# Patient Record
Sex: Male | Born: 1950
Health system: Southern US, Community
[De-identification: ages and names within clinical notes are randomized; demographics above are authoritative.]

## PROBLEM LIST (undated history)

## (undated) DIAGNOSIS — N189 Chronic kidney disease, unspecified: Secondary | ICD-10-CM

## (undated) DIAGNOSIS — E7800 Pure hypercholesterolemia, unspecified: Secondary | ICD-10-CM

## (undated) DIAGNOSIS — R339 Retention of urine, unspecified: Secondary | ICD-10-CM

## (undated) DIAGNOSIS — M109 Gout, unspecified: Secondary | ICD-10-CM

## (undated) DIAGNOSIS — I10 Essential (primary) hypertension: Secondary | ICD-10-CM

## (undated) HISTORY — DX: Chronic kidney disease, unspecified: N18.9

## (undated) HISTORY — PX: NO PAST SURGERIES: SHX2092

---

## 2001-11-29 ENCOUNTER — Encounter (HOSPITAL_COMMUNITY): Admission: RE | Admit: 2001-11-29 | Discharge: 2001-12-29 | Payer: Self-pay | Admitting: Internal Medicine

## 2002-09-14 ENCOUNTER — Ambulatory Visit (HOSPITAL_COMMUNITY): Admission: RE | Admit: 2002-09-14 | Discharge: 2002-09-14 | Payer: Self-pay | Admitting: Internal Medicine

## 2002-09-14 ENCOUNTER — Encounter: Payer: Self-pay | Admitting: Internal Medicine

## 2006-02-08 ENCOUNTER — Ambulatory Visit: Payer: Self-pay | Admitting: Gastroenterology

## 2006-02-08 ENCOUNTER — Ambulatory Visit (HOSPITAL_COMMUNITY): Admission: RE | Admit: 2006-02-08 | Discharge: 2006-02-08 | Payer: Self-pay | Admitting: Gastroenterology

## 2013-04-05 ENCOUNTER — Inpatient Hospital Stay (HOSPITAL_COMMUNITY): Payer: BC Managed Care – PPO

## 2013-04-05 ENCOUNTER — Encounter (HOSPITAL_COMMUNITY): Payer: Self-pay

## 2013-04-05 ENCOUNTER — Inpatient Hospital Stay (HOSPITAL_COMMUNITY)
Admission: EM | Admit: 2013-04-05 | Discharge: 2013-04-08 | DRG: 316 | Disposition: A | Discharge status: Home / Self Care | Payer: BC Managed Care – PPO | Attending: Internal Medicine | Admitting: Internal Medicine

## 2013-04-05 DIAGNOSIS — E875 Hyperkalemia: Secondary | ICD-10-CM | POA: Diagnosis present

## 2013-04-05 DIAGNOSIS — Z833 Family history of diabetes mellitus: Secondary | ICD-10-CM

## 2013-04-05 DIAGNOSIS — N179 Acute kidney failure, unspecified: Principal | ICD-10-CM | POA: Diagnosis present

## 2013-04-05 DIAGNOSIS — D649 Anemia, unspecified: Secondary | ICD-10-CM | POA: Diagnosis present

## 2013-04-05 DIAGNOSIS — Z8249 Family history of ischemic heart disease and other diseases of the circulatory system: Secondary | ICD-10-CM

## 2013-04-05 DIAGNOSIS — E7800 Pure hypercholesterolemia, unspecified: Secondary | ICD-10-CM | POA: Diagnosis present

## 2013-04-05 DIAGNOSIS — N401 Enlarged prostate with lower urinary tract symptoms: Secondary | ICD-10-CM | POA: Diagnosis present

## 2013-04-05 DIAGNOSIS — N138 Other obstructive and reflux uropathy: Secondary | ICD-10-CM | POA: Diagnosis present

## 2013-04-05 DIAGNOSIS — N1330 Unspecified hydronephrosis: Secondary | ICD-10-CM | POA: Diagnosis present

## 2013-04-05 DIAGNOSIS — D638 Anemia in other chronic diseases classified elsewhere: Secondary | ICD-10-CM | POA: Diagnosis present

## 2013-04-05 DIAGNOSIS — Z23 Encounter for immunization: Secondary | ICD-10-CM

## 2013-04-05 DIAGNOSIS — N400 Enlarged prostate without lower urinary tract symptoms: Secondary | ICD-10-CM | POA: Diagnosis present

## 2013-04-05 DIAGNOSIS — M109 Gout, unspecified: Secondary | ICD-10-CM | POA: Diagnosis present

## 2013-04-05 HISTORY — DX: Pure hypercholesterolemia, unspecified: E78.00

## 2013-04-05 HISTORY — DX: Gout, unspecified: M10.9

## 2013-04-05 LAB — CBC WITH DIFFERENTIAL/PLATELET
Basophils Absolute: 0 10*3/uL (ref 0.0–0.1)
Basophils Relative: 0 % (ref 0–1)
Eosinophils Absolute: 0.1 10*3/uL (ref 0.0–0.7)
Eosinophils Relative: 1 % (ref 0–5)
HCT: 30.9 % — ABNORMAL LOW (ref 39.0–52.0)
Hemoglobin: 10.2 g/dL — ABNORMAL LOW (ref 13.0–17.0)
Lymphocytes Relative: 23 % (ref 12–46)
Lymphs Abs: 1.6 10*3/uL (ref 0.7–4.0)
MCHC: 33 g/dL (ref 30.0–36.0)
MCV: 88 fL (ref 78.0–100.0)
Monocytes Absolute: 0.4 10*3/uL (ref 0.1–1.0)
Monocytes Relative: 6 % (ref 3–12)
Neutro Abs: 4.7 10*3/uL (ref 1.7–7.7)
Neutrophils Relative %: 70 % (ref 43–77)
RBC: 3.51 MIL/uL — ABNORMAL LOW (ref 4.22–5.81)
RDW: 13.2 % (ref 11.5–15.5)
WBC: 6.7 10*3/uL (ref 4.0–10.5)

## 2013-04-05 LAB — URINALYSIS, ROUTINE W REFLEX MICROSCOPIC
Bilirubin Urine: NEGATIVE
Glucose, UA: NEGATIVE mg/dL
Ketones, ur: NEGATIVE mg/dL
Leukocytes, UA: NEGATIVE
Nitrite: NEGATIVE
Protein, ur: NEGATIVE mg/dL
Specific Gravity, Urine: <1.005 — ABNORMAL LOW (ref 1.005–1.030)
pH: 6 (ref 5.0–8.0)

## 2013-04-05 LAB — RETICULOCYTES
RBC.: 3.45 MIL/uL — ABNORMAL LOW (ref 4.22–5.81)
Retic Count, Absolute: 20.7 10*3/uL (ref 19.0–186.0)

## 2013-04-05 LAB — BASIC METABOLIC PANEL
BUN: 72 mg/dL — ABNORMAL HIGH (ref 6–23)
CO2: 21 mEq/L (ref 19–32)
Calcium: 9.8 mg/dL (ref 8.4–10.5)
Chloride: 107 mEq/L (ref 96–112)
Creatinine, Ser: 5.16 mg/dL — ABNORMAL HIGH (ref 0.50–1.35)
GFR calc Af Amer: 13 mL/min — ABNORMAL LOW (ref >90)
GFR calc non Af Amer: 11 mL/min — ABNORMAL LOW (ref >90)
Glucose, Bld: 117 mg/dL — ABNORMAL HIGH (ref 70–99)
Potassium: 5.3 mEq/L — ABNORMAL HIGH (ref 3.5–5.1)

## 2013-04-05 LAB — HEPATIC FUNCTION PANEL
AST: 17 U/L (ref 0–37)
Albumin: 4.5 g/dL (ref 3.5–5.2)
Bilirubin, Direct: 0.1 mg/dL (ref 0.0–0.3)
Total Bilirubin: 0.5 mg/dL (ref 0.3–1.2)

## 2013-04-05 LAB — LIPID PANEL
Cholesterol: 135 mg/dL (ref 0–200)
HDL: 39 mg/dL — ABNORMAL LOW (ref >39)
Triglycerides: 104 mg/dL (ref <150)

## 2013-04-05 MED ORDER — HEPARIN SODIUM (PORCINE) 5000 UNIT/ML IJ SOLN
5000.0000 [IU] | Freq: Three times a day (TID) | INTRAMUSCULAR | Status: DC
  Administered 2013-04-05 – 2013-04-08 (×9): 5000 [IU] via SUBCUTANEOUS
  Filled 2013-04-05 (×10): qty 1

## 2013-04-05 MED ORDER — SODIUM CHLORIDE 0.9 % IJ SOLN
3.0000 mL | Freq: Two times a day (BID) | INTRAMUSCULAR | Status: DC
  Administered 2013-04-06: 3 mL via INTRAVENOUS

## 2013-04-05 MED ORDER — ACETAMINOPHEN 650 MG RE SUPP: 650.0000 mg | Freq: Four times a day (QID) | RECTAL | Status: DC | PRN

## 2013-04-05 MED ORDER — TRAZODONE HCL 50 MG PO TABS: 25.0000 mg | ORAL_TABLET | Freq: Every evening | ORAL | Status: DC | PRN

## 2013-04-05 MED ORDER — ACETAMINOPHEN 325 MG PO TABS: 650.0000 mg | ORAL_TABLET | Freq: Four times a day (QID) | ORAL | Status: DC | PRN

## 2013-04-05 MED ORDER — SENNOSIDES-DOCUSATE SODIUM 8.6-50 MG PO TABS: 1.0000 | ORAL_TABLET | Freq: Every evening | ORAL | Status: DC | PRN

## 2013-04-05 MED ORDER — SODIUM CHLORIDE 0.9 % IV SOLN
INTRAVENOUS | Status: DC
  Administered 2013-04-05 – 2013-04-08 (×9): via INTRAVENOUS

## 2013-04-05 MED ORDER — SODIUM POLYSTYRENE SULFONATE 15 GM/60ML PO SUSP
15.0000 g | Freq: Once | ORAL | Status: AC
  Administered 2013-04-05: 15 g via ORAL
  Filled 2013-04-05: qty 60

## 2013-04-05 MED ORDER — ONDANSETRON HCL 4 MG PO TABS: 4.0000 mg | ORAL_TABLET | Freq: Four times a day (QID) | ORAL | Status: DC | PRN

## 2013-04-05 MED ORDER — ONDANSETRON HCL 4 MG/2ML IJ SOLN: 4.0000 mg | Freq: Four times a day (QID) | INTRAMUSCULAR | Status: DC | PRN

## 2013-04-05 MED ORDER — ALUM & MAG HYDROXIDE-SIMETH 200-200-20 MG/5ML PO SUSP
30.0000 mL | Freq: Four times a day (QID) | ORAL | Status: DC | PRN
Start: 2013-04-05 — End: 2013-04-08

## 2013-04-05 MED ORDER — HYDROCODONE-ACETAMINOPHEN 5-325 MG PO TABS
1.0000 | ORAL_TABLET | ORAL | Status: DC | PRN
  Administered 2013-04-06 (×2): 1 via ORAL
  Filled 2013-04-05 (×2): qty 1

## 2013-04-05 NOTE — H&P (Signed)
[facility] History and Physical  Noah Cook WUJ:[id number] DOB: 11/21/1950 DOA: 04/05/2013  Referring physician:  PCP: Colette Ribas, MD  Specialists:   Chief Complaint: Abnormal labs  HPI: Noah Cook is a very pleasant 62 y.o. male with a past medical history that includes gout, hypercholesterolemia, BPH who presents to the emergency department today with the chief complaint of abnormal labs. Information is obtained from the patient and the wife who is at the bedside. He indicates that he was put on allopurinol for a year ago and ever since then he has routine lab work usually yearly. He states that he had blood work done 2 days ago and that his primary care provider called him to tell him that "his kidneys were not functioning properly. He was advised to come to the emergency department. He denies any recent dysuria hematuria urgency or flank pain. He does report that he urinates frequently and has some hesitancy with initiating flow that he attributes to an enlarged prostate. He also indicates that when he was put on allopurinol his primary care provider told him to "drink plenty of water". He states that since that time he has made a conscious effort to drink water. He denies any fever chills nausea vomiting or sick contacts. He does indicate that he had a headache for several days last week but he took Tylenol and resolved. He denies any chest pain palpitations shortness of breath or cough. He indicates that he has not had a gout attack since she's been put on the allopurinol and prior to that time he would have severe gout attacks in multiple joints every 2 months. Initial evaluation in the emergency department yields a hemoglobin of 10.2 potassium of 5.3 creatinine of 5.16 and a BUN of 72. EKG yields normal sinus rhythm. Symptoms came on suddenly characterized as moderate. [facility] are asked to admit.   Review of Systems: 10 point review of systems completed all  systems are negative except as indicated in the history of present illness.  Past Medical History  Diagnosis Date  . Gout   . Hypercholesteremia    History reviewed. No pertinent past surgical history. Social History:  reports that he has never smoked. He does not have any smokeless tobacco history on file. He reports that he does not drink alcohol or use illicit drugs. Patient is married and lives at home with his wife. He is a retired Investment banker, corporate. He plays golf frequently and is independent with ADLs.  No Known Allergies  No family history on file. mother is alive 60 years old she has diabetes and a history of CAD. Father deceased at 75 years old from an MI. He has 2 siblings whose collective medical history is positive for diabetes and ovarian cancer.  Prior to Admission medications   Medication Sig Start Date End Date Taking? Authorizing Provider  allopurinol (ZYLOPRIM) 300 MG tablet Take 300 mg by mouth daily.   Yes Historical Provider, MD  simvastatin (ZOCOR) 20 MG tablet Take 20 mg by mouth daily.   Yes Historical Provider, MD   Physical Exam: Filed Vitals:   04/05/13 0909  BP: 161/70  Pulse: 65  Temp: 97.7 F (36.5 C)  Resp: 20     General:  Well-nourished looking a bit older than his stated age no acute distress  Eyes: PE RRL, EOMI, no scleral icterus  ENT: Ears clear nose without drainage oropharynx without erythema or exudate. Mucous membranes of his mouth are pink slightly  dry  Neck: Supple no JVD full range of motion no lymphadenopathy  Cardiovascular: Regular rate and rhythm. No murmur no gallop no rub. Trace lower extremity edema nonpitting  Respiratory: Normal effort breath sounds clear bilaterally to auscultation. No wheeze no rhonchi no crackles  Abdomen: Round soft positive bowel sounds nondistended. Nontender to palpation no mass organomegaly noted no costovertebral angle tenderness  Skin: Warm and dry no rash no lesions  noted  Musculoskeletal: Moves all extremities joints without swelling or erythema. Joints nontender  Psychiatric: Calm cooperative  Neurologic: Cranial nerves II through XII grossly intact speech clear  Labs on Admission:  Basic Metabolic Panel:  Recent Labs Lab 04/05/13 0924  NA 140  K 5.3*  CL 107  CO2 21  GLUCOSE 117*  BUN 72*  CREATININE 5.16*  CALCIUM 9.8   Liver Function Tests: No results found for this basename: AST, ALT, ALKPHOS, BILITOT, PROT, ALBUMIN,  in the last 168 hours No results found for this basename: LIPASE, AMYLASE,  in the last 168 hours No results found for this basename: AMMONIA,  in the last 168 hours CBC:  Recent Labs Lab 04/05/13 0924  WBC 6.7  NEUTROABS 4.7  HGB 10.2*  HCT 30.9*  MCV 88.0  PLT 204   Cardiac Enzymes: No results found for this basename: CKTOTAL, CKMB, CKMBINDEX, TROPONINI,  in the last 168 hours  BNP (last 3 results) No results found for this basename: PROBNP,  in the last 8760 hours CBG: No results found for this basename: GLUCAP,  in the last 168 hours  Radiological Exams on Admission: No results found.  EKG: Independently reviewed normal sinus rhythm  Assessment/Plan Principal Problem:   Acute renal failure: Etiology unclear in patient with pre-renal pattern. Will admit to telemetry. We'll vigorously hydrate with IV fluids. Hold his home medications for now. Will monitor intake and output. If no improvement will consider renal ultrasound.  Active Problems: Hyperkalemia: Likely related to #1. Will provide IV fluids. Will recheck in the a.m. Will monitor on telemetry. EKG with normal sinus rhythm.   Anemia:normocytic. Denies melena or BRBPR. Will check FOBT. Obtain anemia panel. Monitor    Gout: Stable at baseline. Patient indicates he has not had a gout flare since she's been on allopurinol. Is verbalizing some concern regarding the possibility of having to be taken off allopurinol if it is toxic to his  kidneys.    Hypercholesteremia: Will hold his statin for now. Will check liver function function tests and lipid panel.  BPH: Stable at baseline.    Code Status:full  Family Communication: wife at bedside Disposition Plan: home when ready Time spent: 60 minutes  Gwenyth Bender [facility] Pager [phone]  If 7PM-7AM, please contact night-coverage [email or web address] Password [facility] 04/05/2013, 11:51 AM

## 2013-04-05 NOTE — ED Provider Notes (Signed)
CSN: [id number]     Arrival date & time 04/05/13  2956 History  This chart was scribed for Candyce Churn, MD by Quintella Reichert, ED scribe.  This patient was seen in room APA02/APA02 and the patient's care was started at 9:57 AM.   Chief Complaint  Patient presents with  . Abnormal Lab    The history is provided by the patient. No language interpreter was used.    HPI Comments: Noah Cook is a 62 y.o. male with h/o gout and hypercholesteremia who presents to the Emergency Department complaining of abnormal labs.  Pt had bloodwork taken by his PCP 2 days ago which revealed creatinine of 4.75 and potassium of 5.4 and today he was advised to come to the ED.  He has no complaints presently and denies pain to any area, fever, CP, SOB, emesis, diarrhea, or any other associated symptoms.  He denies prior h/o kidney problems to his knowledge.  He states he urinates frequently but this is chronic for him.  Pt has been taking allopurinol for over a year.   Past Medical History  Diagnosis Date  . Gout   . Hypercholesteremia    History reviewed. No pertinent past surgical history.  No family history on file.  History  Substance Use Topics  . Smoking status: Never Smoker   . Smokeless tobacco: Not on file  . Alcohol Use: No     Review of Systems  Constitutional: Negative for fever.  Respiratory: Negative for shortness of breath.   Cardiovascular: Negative for chest pain.  Gastrointestinal: Negative for vomiting and diarrhea.  Genitourinary: Positive for frequency (chronic, at baseline).  All other systems reviewed and are negative.     Allergies  Review of patient's allergies indicates no known allergies.  Home Medications   Current Outpatient Rx  Name  Route  Sig  Dispense  Refill  . allopurinol (ZYLOPRIM) 300 MG tablet   Oral   Take 300 mg by mouth daily.         . simvastatin (ZOCOR) 20 MG tablet   Oral   Take 20 mg by mouth daily.          BP 161/70   Pulse 65  Temp(Src) 97.7 F (36.5 C) (Oral)  Resp 20  Ht 5\' 6"  (1.676 m)  Wt 218 lb (98.884 kg)  BMI 35.2 kg/m2  SpO2 99%  Physical Exam  Nursing note and vitals reviewed. Constitutional: He is oriented to person, place, and time. He appears well-developed and well-nourished. No distress.  HENT:  Head: Normocephalic and atraumatic.  Mouth/Throat: Oropharynx is clear and moist.  Eyes: Conjunctivae are normal. Pupils are equal, round, and reactive to light. No scleral icterus.  Neck: Neck supple.  Cardiovascular: Normal rate, regular rhythm, normal heart sounds and intact distal pulses.   No murmur heard. Pulmonary/Chest: Effort normal and breath sounds normal. No stridor. No respiratory distress. He has no wheezes. He has no rales.  Abdominal: Soft. He exhibits no distension. There is no tenderness.  Musculoskeletal: Normal range of motion. He exhibits no edema.  Neurological: He is alert and oriented to person, place, and time.  Skin: Skin is warm and dry. No rash noted.  Psychiatric: He has a normal mood and affect. His behavior is normal.    ED Course  Procedures (including critical care time)  DIAGNOSTIC STUDIES: Oxygen Saturation is 99% on room air, normal by my interpretation.    COORDINATION OF CARE: 10:00 AM: Discussed treatment plan which includes  labs and likely admission.  Pt expressed understanding and agreed to plan.   Labs Review Labs Reviewed  CBC WITH DIFFERENTIAL - Abnormal; Notable for the following:    RBC 3.51 (*)    Hemoglobin 10.2 (*)    HCT 30.9 (*)    All other components within normal limits  BASIC METABOLIC PANEL - Abnormal; Notable for the following:    Potassium 5.3 (*)    Glucose, Bld 117 (*)    BUN 72 (*)    Creatinine, Ser 5.16 (*)    GFR calc non Af Amer 11 (*)    GFR calc Af Amer 13 (*)    All other components within normal limits  URINALYSIS, ROUTINE W REFLEX MICROSCOPIC - Abnormal; Notable for the following:    Specific Gravity,  Urine <1.005 (*)    Hgb urine dipstick SMALL (*)    All other components within normal limits  URINE MICROSCOPIC-ADD ON - Abnormal; Notable for the following:    Bacteria, UA FEW (*)    All other components within normal limits   EKG - NSR, rate 60, normal axis, normal intervals, no ST/T changes  Imaging Review No results found.  MDM   1. Acute renal failure   2. Gout   3. Hypercholesteremia    62 year old male with acute undifferentiated renal failure. Potassium mildly elevated. New EKG changes. Admitted to internal medicine.    I personally performed the services described in this documentation, which was scribed in my presence. The recorded information has been reviewed and is accurate.    Candyce Churn, MD 04/05/13 [id number]

## 2013-04-05 NOTE — H&P (Signed)
Patient seen, independently examined and chart reviewed. I agree with exam, assessment and plan discussed with Toya Smothers, NP.  Very pleasant 62 year old man with a history of gout, well controlled with allopurinol who was seen recently by his primary care physician for routine followup and refill on allopurinol. Routine blood work was checked which revealed acute renal failure. Hydration was encouraged and repeat lab work was checked which showed no substantial improvement. Laboratory workup in the emergency department confirmed acute renal failure.  The patient feels well and has no recent complaints. He has not had an attack of gout in 2 or more years. He voids well and makes adequate urine. He has been told that he has a large prostate but does not feel that this significantly interferes with emptying. Besides allopurinol he takes cholesterol medication. He takes no NSAIDs or diuretics.  He is afebrile with stable vital signs. He appears well and exam was unremarkable. Laboratory workup was notable for mild hyperkalemia, BUN 72, creatinine 5.16. EKG showed normal sinus rhythm with no acute changes.  Etiology of his acute renal failure is unclear. While both allopurinol and statin therapy could cause renal failure this is not favored. Pattern does suggest a prerenal etiology and he does have a history suggestive of BPH.  Will start with hydration, renal ultrasound, urine studies and repeat basic metabolic panel in the morning. Will consult nephrology in the morning. Minimal hyperkalemia currently asymptomatic, treat with Kayexalate and IV fluids.  Discussed above with sister, wife and son at bedside. All questions answered.  Brendia Sacks, MD [facility] [phone]

## 2013-04-05 NOTE — Progress Notes (Signed)
Pt came up to floor from ED in NAD. Will continue to monitor.  

## 2013-04-05 NOTE — ED Notes (Signed)
Pt was sent by pmd, told that lab work from Monday was ab. Normal. His "potassium was high and kidney function was off", pt denies any complaints

## 2013-04-06 DIAGNOSIS — N1330 Unspecified hydronephrosis: Secondary | ICD-10-CM

## 2013-04-06 DIAGNOSIS — N400 Enlarged prostate without lower urinary tract symptoms: Secondary | ICD-10-CM

## 2013-04-06 LAB — BASIC METABOLIC PANEL
BUN: 62 mg/dL — ABNORMAL HIGH (ref 6–23)
CO2: 21 mEq/L (ref 19–32)
Calcium: 9.3 mg/dL (ref 8.4–10.5)
Chloride: 108 mEq/L (ref 96–112)
Creatinine, Ser: 5.01 mg/dL — ABNORMAL HIGH (ref 0.50–1.35)
Glucose, Bld: 121 mg/dL — ABNORMAL HIGH (ref 70–99)

## 2013-04-06 LAB — CBC
HCT: 27.9 % — ABNORMAL LOW (ref 39.0–52.0)
MCH: 29.6 pg (ref 26.0–34.0)
MCHC: 33.3 g/dL (ref 30.0–36.0)
MCV: 88.9 fL (ref 78.0–100.0)
Platelets: 187 10*3/uL (ref 150–400)
RDW: 13.4 % (ref 11.5–15.5)

## 2013-04-06 LAB — VITAMIN B12: Vitamin B-12: 516 pg/mL (ref 211–911)

## 2013-04-06 LAB — IRON AND TIBC
Iron: 119 ug/dL (ref 42–135)
Saturation Ratios: 56 % — ABNORMAL HIGH (ref 20–55)
UIBC: 95 ug/dL — ABNORMAL LOW (ref 125–400)

## 2013-04-06 LAB — FOLATE: Folate: >20 ng/mL

## 2013-04-06 MED ORDER — TAMSULOSIN HCL 0.4 MG PO CAPS
0.4000 mg | ORAL_CAPSULE | Freq: Every day | ORAL | Status: DC
  Administered 2013-04-06 – 2013-04-08 (×3): 0.4 mg via ORAL
  Filled 2013-04-06 (×3): qty 1

## 2013-04-06 NOTE — Progress Notes (Signed)
[facility] PROGRESS NOTE  Noah Cook ZOX:[id number] DOB: Jan 19, 1951 DOA: 04/05/2013 PCP: Colette Ribas, MD  Assessment/Plan: Acute renal failure: likely related to obstructive uropathy secondary to BPH. Renal US yields moderate bilateral hydronephrosis and substantial post void residual. Will provide foley. Continue to  hydrate with IV fluids. Will monitor intake and output. Creatinine trending downward slightly. Continue to monitor.   Active Problems:   Hyperkalemia: Likely related to #1. Resolved s/p kayexalate.   Anemia:normocytic. Denies melena or BRBPR. Will check FOBT. Anemia panel yields TIBC 214, ferritin 399, RBC 3.45.   Gout: Stable at baseline. Patient indicates he has not had a gout flare since she's been on allopurinol. Continue to hold allopurinol until renal function improves.   Hypercholesteremia: Will hold his statin for now. Lipid panel unremarkable. LFT's unremarkable   BPH: Will need OP urology work up. Pt wishes to see Dr Annabell Howells. Will arrange  Code Status: full Family Communication: wife at bedside Disposition Plan: hopefully home tomorro   Consultants:  nephrology  Procedures:  none  Antibiotics:  none  HPI/Subjective: Awake alert NAD. Denies pain/discomfort  Objective: Filed Vitals:   04/06/13 0646  BP: 133/77  Pulse: 62  Temp: 97.8 F (36.6 C)  Resp: 20    Intake/Output Summary (Last 24 hours) at 04/06/13 0954 Last data filed at 04/06/13 0649  Gross per 24 hour  Intake    240 ml  Output   2400 ml  Net  -2160 ml   Filed Weights   04/05/13 0909 04/05/13 1209  Weight: 98.884 kg (218 lb) 98.884 kg (218 lb)    Exam:   General:  Obese NAD  Cardiovascular: RRR No MGR No LE edema  Respiratory: normal effort BS clear to auscultation bilaterally. No wheeze no rhonchi  Abdomen: obese soft +BS non-tender to palpation Foley draining clear yellow uring  Musculoskeletal: No clubbing no cyanosis. Joints without  swelling/erythema   Data Reviewed: Basic Metabolic Panel:  Recent Labs Lab 04/05/13 0924 04/06/13 0546  NA 140 140  K 5.3* 4.6  CL 107 108  CO2 21 21  GLUCOSE 117* 121*  BUN 72* 62*  CREATININE 5.16* 5.01*  CALCIUM 9.8 9.3   Liver Function Tests:  Recent Labs Lab 04/05/13 1100  AST 17  ALT 10  ALKPHOS 49  BILITOT 0.5  PROT 7.3  ALBUMIN 4.5   No results found for this basename: LIPASE, AMYLASE,  in the last 168 hours No results found for this basename: AMMONIA,  in the last 168 hours CBC:  Recent Labs Lab 04/05/13 0924 04/06/13 0546  WBC 6.7 6.6  NEUTROABS 4.7  --   HGB 10.2* 9.3*  HCT 30.9* 27.9*  MCV 88.0 88.9  PLT 204 187   Cardiac Enzymes: No results found for this basename: CKTOTAL, CKMB, CKMBINDEX, TROPONINI,  in the last 168 hours BNP (last 3 results) No results found for this basename: PROBNP,  in the last 8760 hours CBG: No results found for this basename: GLUCAP,  in the last 168 hours  No results found for this or any previous visit (from the past 240 hour(s)).   Studies: US Renal  04/05/2013   *RADIOLOGY REPORT*  Clinical Data: Acute renal failure  RENAL / URINARY TRACT ULTRASOUND  Technique:  Complete ultrasound exam of the kidneys and urinary bladder was performed.  Comparison: No comparison studies available.  Findings:  The right kidney measures 13.6 cm in long axis.  The left kidney measures 13.1 cm.  Bilateral moderate hydronephrosis is  evident.  The bladder is markedly distended.  Prevoid volume is calculated at 1250 ml.  Postvoid residual is calculated at 1009 ml.  Impression:  Moderate bilateral hydronephrosis with substantial postvoid residual in the urinary bladder.   Original Report Authenticated By: Kennith Center, M.D.    Scheduled Meds: . heparin  5,000 Units Subcutaneous Q8H  . sodium chloride  3 mL Intravenous Q12H   Continuous Infusions: . sodium chloride 150 mL/hr at 04/06/13 0409    Principal Problem:   Acute renal  failure Active Problems:   Gout   Hypercholesteremia   BPH (benign prostatic hyperplasia)   Anemia   Hyperkalemia   Hydronephrosis    Time spent: 30 minutes    [facility] M  [facility] Pager [phone]. If 7PM-7AM, please contact night-coverage at [email or web address], password [facility] 04/06/2013, 9:54 AM  LOS: 1 day

## 2013-04-06 NOTE — Progress Notes (Signed)
1210 - Patient c/o of pain and burning with foley catheter.  Urine now pink/red in tubing and bag.  Dr. Irene Limbo notified.  No new orders.  Continue to monitor.

## 2013-04-06 NOTE — Consult Note (Signed)
Reason for Consult: Acute kidney injury Referring Physician: Dr. Julieanne Cotton is an 62 y.o. male.  HPI: He patient was history of gout, hypercholesterolemia presently seen by his primary care physician and blood work was done and patient was found to have elevated BUN and creatinine hence sent to the emergency room for further evaluation. Patient denies any previous history of kidney stone or chronic renal failure. Patient states that he had history of recurrent gout because of such was put on allopurinol and has been doing very well for the last couple of years. Recently since he doesn't have any reactivation of his gout patient is not taking any nonsteroidal. The only complaint he has is some urgency and difficulty in initiation of his urine. According to the patient he was told that he has an enlarged prostate during his a normal physical exam. Presently he denies any nausea or vomiting and no other complaints.  Past Medical History  Diagnosis Date  . Gout   . Hypercholesteremia     History reviewed. No pertinent past surgical history.  No family history on file.  Social History:  reports that he has never smoked. He does not have any smokeless tobacco history on file. He reports that he does not drink alcohol or use illicit drugs.  Allergies: No Known Allergies  Medications: I have reviewed the patient's current medications.  Results for orders placed during the hospital encounter of 04/05/13 (from the past 48 hour(s))  CBC WITH DIFFERENTIAL     Status: Abnormal   Collection Time    04/05/13  9:24 AM      Result Value Range   WBC 6.7  4.0 - 10.5 K/uL   RBC 3.51 (*) 4.22 - 5.81 MIL/uL   Hemoglobin 10.2 (*) 13.0 - 17.0 g/dL   HCT 14.7 (*) 82.9 - 56.2 %   MCV 88.0  78.0 - 100.0 fL   MCH 29.1  26.0 - 34.0 pg   MCHC 33.0  30.0 - 36.0 g/dL   RDW 13.0  86.5 - 78.4 %   Platelets 204  150 - 400 K/uL   Neutrophils Relative % 70  43 - 77 %   Neutro Abs 4.7  1.7 - 7.7 K/uL    Lymphocytes Relative 23  12 - 46 %   Lymphs Abs 1.6  0.7 - 4.0 K/uL   Monocytes Relative 6  3 - 12 %   Monocytes Absolute 0.4  0.1 - 1.0 K/uL   Eosinophils Relative 1  0 - 5 %   Eosinophils Absolute 0.1  0.0 - 0.7 K/uL   Basophils Relative 0  0 - 1 %   Basophils Absolute 0.0  0.0 - 0.1 K/uL  BASIC METABOLIC PANEL     Status: Abnormal   Collection Time    04/05/13  9:24 AM      Result Value Range   Sodium 140  135 - 145 mEq/L   Potassium 5.3 (*) 3.5 - 5.1 mEq/L   Chloride 107  96 - 112 mEq/L   CO2 21  19 - 32 mEq/L   Glucose, Bld 117 (*) 70 - 99 mg/dL   BUN 72 (*) 6 - 23 mg/dL   Creatinine, Ser 6.96 (*) 0.50 - 1.35 mg/dL   Calcium 9.8  8.4 - 29.5 mg/dL   GFR calc non Af Amer 11 (*) >90 mL/min   GFR calc Af Amer 13 (*) >90 mL/min   Comment: (NOTE)     The eGFR has  been calculated using the CKD EPI equation.     This calculation has not been validated in all clinical situations.     eGFR's persistently <90 mL/min signify possible Chronic Kidney     Disease.  URINALYSIS, ROUTINE W REFLEX MICROSCOPIC     Status: Abnormal   Collection Time    04/05/13  9:24 AM      Result Value Range   Color, Urine YELLOW  YELLOW   APPearance CLEAR  CLEAR   Specific Gravity, Urine <1.005 (*) 1.005 - 1.030   pH 6.0  5.0 - 8.0   Glucose, UA NEGATIVE  NEGATIVE mg/dL   Hgb urine dipstick SMALL (*) NEGATIVE   Bilirubin Urine NEGATIVE  NEGATIVE   Ketones, ur NEGATIVE  NEGATIVE mg/dL   Protein, ur NEGATIVE  NEGATIVE mg/dL   Urobilinogen, UA 0.2  0.0 - 1.0 mg/dL   Nitrite NEGATIVE  NEGATIVE   Leukocytes, UA NEGATIVE  NEGATIVE  URINE MICROSCOPIC-ADD ON     Status: Abnormal   Collection Time    04/05/13  9:24 AM      Result Value Range   RBC / HPF 7-10  <3 RBC/hpf   Bacteria, UA FEW (*) RARE  HEPATIC FUNCTION PANEL     Status: None   Collection Time    04/05/13 11:00 AM      Result Value Range   Total Protein 7.3  6.0 - 8.3 g/dL   Albumin 4.5  3.5 - 5.2 g/dL   AST 17  0 - 37 U/L   ALT 10  0  - 53 U/L   Alkaline Phosphatase 49  39 - 117 U/L   Total Bilirubin 0.5  0.3 - 1.2 mg/dL   Bilirubin, Direct 0.1  0.0 - 0.3 mg/dL   Indirect Bilirubin 0.4  0.3 - 0.9 mg/dL  VITAMIN E45     Status: None   Collection Time    04/05/13 11:00 AM      Result Value Range   Vitamin B-12 516  211 - 911 pg/mL   Comment: Performed at [facility]  FOLATE     Status: None   Collection Time    04/05/13 11:00 AM      Result Value Range   Folate >20.0     Comment: (NOTE)     Reference Ranges            Deficient:       0.4 - 3.3 ng/mL            Indeterminate:   3.4 - 5.4 ng/mL            Normal:              > 5.4 ng/mL     Performed at [facility]  IRON AND TIBC     Status: Abnormal   Collection Time    04/05/13 11:00 AM      Result Value Range   Iron 119  42 - 135 ug/dL   TIBC 409 (*) 811 - 914 ug/dL   Saturation Ratios 56 (*) 20 - 55 %   UIBC 95 (*) 125 - 400 ug/dL   Comment: Performed at [facility]  FERRITIN     Status: Abnormal   Collection Time    04/05/13 11:00 AM      Result Value Range   Ferritin 399 (*) 22 - 322 ng/mL   Comment: Performed at [facility]  RETICULOCYTES     Status:  Abnormal   Collection Time    04/05/13 11:00 AM      Result Value Range   Retic Ct Pct 0.6  0.4 - 3.1 %   RBC. 3.45 (*) 4.22 - 5.81 MIL/uL   Retic Count, Manual 20.7  19.0 - 186.0 K/uL  LIPID PANEL     Status: Abnormal   Collection Time    04/05/13 11:00 AM      Result Value Range   Cholesterol 135  0 - 200 mg/dL   Triglycerides 161  <096 mg/dL   HDL 39 (*) >04 mg/dL   Total CHOL/HDL Ratio 3.5     VLDL 21  0 - 40 mg/dL   LDL Cholesterol 75  0 - 99 mg/dL   Comment:            Total Cholesterol/HDL:CHD Risk     Coronary Heart Disease Risk Table                         Men   Women      1/2 Average Risk   3.4   3.3      Average Risk       5.0   4.4      2 X Average Risk   9.6   7.1      3 X Average Risk  23.4   11.0                Use the calculated  Patient Ratio     above and the CHD Risk Table     to determine the patient's CHD Risk.                ATP III CLASSIFICATION (LDL):      <100     mg/dL   Optimal      540-981  mg/dL   Near or Above                        Optimal      130-159  mg/dL   Borderline      191-478  mg/dL   High      >295     mg/dL   Very High  SODIUM, URINE, RANDOM     Status: None   Collection Time    04/05/13  7:15 PM      Result Value Range   Sodium, Ur 55    CREATININE, URINE, RANDOM     Status: None   Collection Time    04/05/13  7:15 PM      Result Value Range   Creatinine, Urine 44.76    BASIC METABOLIC PANEL     Status: Abnormal   Collection Time    04/06/13  5:46 AM      Result Value Range   Sodium 140  135 - 145 mEq/L   Potassium 4.6  3.5 - 5.1 mEq/L   Chloride 108  96 - 112 mEq/L   CO2 21  19 - 32 mEq/L   Glucose, Bld 121 (*) 70 - 99 mg/dL   BUN 62 (*) 6 - 23 mg/dL   Creatinine, Ser 6.21 (*) 0.50 - 1.35 mg/dL   Calcium 9.3  8.4 - 30.8 mg/dL   GFR calc non Af Amer 11 (*) >90 mL/min   GFR calc Af Amer 13 (*) >90 mL/min   Comment: (NOTE)     The eGFR has been calculated using the  CKD EPI equation.     This calculation has not been validated in all clinical situations.     eGFR's persistently <90 mL/min signify possible Chronic Kidney     Disease.  CBC     Status: Abnormal   Collection Time    04/06/13  5:46 AM      Result Value Range   WBC 6.6  4.0 - 10.5 K/uL   RBC 3.14 (*) 4.22 - 5.81 MIL/uL   Hemoglobin 9.3 (*) 13.0 - 17.0 g/dL   HCT 16.1 (*) 09.6 - 04.5 %   MCV 88.9  78.0 - 100.0 fL   MCH 29.6  26.0 - 34.0 pg   MCHC 33.3  30.0 - 36.0 g/dL   RDW 40.9  81.1 - 91.4 %   Platelets 187  150 - 400 K/uL    US Renal  04/05/2013   *RADIOLOGY REPORT*  Clinical Data: Acute renal failure  RENAL / URINARY TRACT ULTRASOUND  Technique:  Complete ultrasound exam of the kidneys and urinary bladder was performed.  Comparison: No comparison studies available.  Findings:  The right kidney  measures 13.6 cm in long axis.  The left kidney measures 13.1 cm.  Bilateral moderate hydronephrosis is evident.  The bladder is markedly distended.  Prevoid volume is calculated at 1250 ml.  Postvoid residual is calculated at 1009 ml.  Impression:  Moderate bilateral hydronephrosis with substantial postvoid residual in the urinary bladder.   Original Report Authenticated By: Kennith Center, M.D.    Review of Systems  Constitutional: Negative for fever and chills.  Respiratory: Negative for shortness of breath.   Cardiovascular: Negative for orthopnea and leg swelling.  Gastrointestinal: Negative for nausea, vomiting and diarrhea.  Neurological: Negative for weakness.   Blood pressure 133/77, pulse 62, temperature 97.8 F (36.6 C), temperature source Oral, resp. rate 20, height 5\' 6"  (1.676 m), weight 98.884 kg (218 lb), SpO2 97.00%. Physical Exam  Constitutional: No distress.  Neck: No JVD present.  Cardiovascular: Normal rate and regular rhythm.   No murmur heard. Respiratory: No respiratory distress. He has no wheezes.  GI: There is no tenderness. There is no rebound.  Musculoskeletal: He exhibits no edema.    Assessment/Plan: Problem #1 acute kidney injury. At this moment with significant amount of urine in the bladder and also bilateral moderate hydronephrosis the etiology for his acute kidney injury seems to be secondary to obstructive uropathy. Presently other etiologies cannot ruled out. Problem #2 history of gout. Presently patient is on allopurinol and no recent activation. Problem #3 history of hypercholesterolemia Problem #4 history of BPH Problem #5 anemia most likely iron deficiency. Problem #6 mild hyperkalemia presently corrected. Plan: Agree with hydration Discussed with the patient about putting the catheter and patient has agreed. We'll check his basic metabolic panel in the morning. Patient possibly would benefit from urology evaluation for his BPH. We'll check iron  studies in the morning and also his uric acid level.  Exodus Kutzer S 04/06/2013, 9:13 AM

## 2013-04-06 NOTE — Care Management Note (Unsigned)
    Page 1 of 1   04/06/2013     3:13:06 PM   CARE MANAGEMENT NOTE 04/06/2013  Patient:  Noah Cook, Noah Cook   Account Number:  [id number]  Date Initiated:  04/06/2013  Documentation initiated by:  Rosemary Holms  Subjective/Objective Assessment:   Pt lives at home with spouse. Declined HH/DME. Will follow     Action/Plan:   Anticipated DC Date:     Anticipated DC Plan:        DC Planning Services  CM consult      Choice offered to / List presented to:             Status of service:  In process, will continue to follow Medicare Important Message given?   (If response is "NO", the following Medicare IM given date fields will be blank) Date Medicare IM given:   Date Additional Medicare IM given:    Discharge Disposition:    Per UR Regulation:    If discussed at Long Length of Stay Meetings, dates discussed:    Comments:  04/06/13 Rosemary Holms RN BSN CM

## 2013-04-06 NOTE — Progress Notes (Signed)
UR Chart Review Completed  

## 2013-04-06 NOTE — Progress Notes (Signed)
Patient seen, independently examined and chart reviewed. I agree with exam, assessment and plan discussed with Toya Smothers, NP.  Renal ultrasound demonstrated moderate bilateral hydronephrosis with substantial postvoid residual in the urinary bladder. BUN and creatinine modestly improved today.  He feels fine. He has no complaints. He appears calm and comfortable.  Presumably his acute renal failure secondary to urinary retention from BPH. Foley catheter placed with immediate output of a large amount of urine. Plan repeat basic metabolic panel in the morning and likely discharge home if improved. Outpatient followup will be arranged with urology.  Etiology of his anemia is unclear. He has no active bleeding. Will need outpatient followup, consider outpatient GI referral.  Message left for PCP to update on care.  Brendia Sacks, MD [facility] [phone]

## 2013-04-07 LAB — BASIC METABOLIC PANEL
BUN: 56 mg/dL — ABNORMAL HIGH (ref 6–23)
CO2: 24 mEq/L (ref 19–32)
CO2: 24 mEq/L (ref 19–32)
Calcium: 9.4 mg/dL (ref 8.4–10.5)
Creatinine, Ser: 4.45 mg/dL — ABNORMAL HIGH (ref 0.50–1.35)
GFR calc non Af Amer: 13 mL/min — ABNORMAL LOW (ref >90)
Glucose, Bld: 125 mg/dL — ABNORMAL HIGH (ref 70–99)
Glucose, Bld: 112 mg/dL — ABNORMAL HIGH (ref 70–99)
Potassium: 4.7 mEq/L (ref 3.5–5.1)
Sodium: 141 mEq/L (ref 135–145)
Sodium: 140 mEq/L (ref 135–145)

## 2013-04-07 LAB — IRON AND TIBC: Iron: 120 ug/dL (ref 42–135)

## 2013-04-07 LAB — CBC
MCH: 29.3 pg (ref 26.0–34.0)
MCHC: 33.1 g/dL (ref 30.0–36.0)
MCV: 88.6 fL (ref 78.0–100.0)
Platelets: 191 10*3/uL (ref 150–400)
RBC: 3.51 MIL/uL — ABNORMAL LOW (ref 4.22–5.81)

## 2013-04-07 MED ORDER — HYDROCODONE-ACETAMINOPHEN 5-325 MG PO TABS: 1.0000 | ORAL_TABLET | ORAL | Status: DC | PRN

## 2013-04-07 MED ORDER — TAMSULOSIN HCL 0.4 MG PO CAPS: 0.4000 mg | ORAL_CAPSULE | Freq: Every day | ORAL | Status: DC

## 2013-04-07 MED ORDER — PNEUMOCOCCAL VAC POLYVALENT 25 MCG/0.5ML IJ INJ
0.5000 mL | INJECTION | Freq: Once | INTRAMUSCULAR | Status: AC
  Administered 2013-04-07: 0.5 mL via INTRAMUSCULAR
  Filled 2013-04-07: qty 0.5

## 2013-04-07 NOTE — Discharge Summary (Signed)
Afternoon creatinine still elevated at 4.0 weight with significant BUN. We'll plan to keep patient overnight and recheck creatinine tomorrow. If improved, can likely go home tomorrow. Otherwise, patient seen and discussed and examined with my nurse practitioner. Agree with above assessment and plan for discharge hopefully tomorrow.

## 2013-04-07 NOTE — Progress Notes (Signed)
Subjective: Interval History: has no complaint of nausea or vomiting. Presently he denies any difficulty increasing. Overall patient offers no complaints..  Objective: Vital signs in last 24 hours: Temp:  [97.9 F (36.6 C)-98.2 F (36.8 C)] 98.2 F (36.8 C) (09/19 0522) Pulse Rate:  [63-67] 67 (09/19 0522) Resp:  [18-20] 20 (09/19 0522) BP: (125-147)/(72-78) 147/72 mmHg (09/19 0522) SpO2:  [97 %-99 %] 98 % (09/19 0522) Weight change:   Intake/Output from previous day: 09/18 0701 - 09/19 0700 In: 5369.2 [P.O.:720; I.V.:4649.2] Out: 8000 [Urine:8000] Intake/Output this shift:    General appearance: alert, cooperative and no distress Resp: clear to auscultation bilaterally Cardio: regular rate and rhythm, S1, S2 normal, no murmur, click, rub or gallop GI: soft, non-tender; bowel sounds normal; no masses,  no organomegaly Extremities: extremities normal, atraumatic, no cyanosis or edema  Lab Results:  Recent Labs  04/06/13 0546 04/07/13 0552  WBC 6.6 7.5  HGB 9.3* 10.3*  HCT 27.9* 31.1*  PLT 187 191   BMET:  Recent Labs  04/06/13 0546 04/07/13 0552  NA 140 141  K 4.6 5.0  CL 108 109  CO2 21 24  GLUCOSE 121* 125*  BUN 62* 56*  CREATININE 5.01* 4.45*  CALCIUM 9.3 9.4   No results found for this basename: PTH,  in the last 72 hours Iron Studies:  Recent Labs  04/05/13 1100  IRON 119  TIBC 214*  FERRITIN 399*    Studies/Results: US Renal  04/05/2013   *RADIOLOGY REPORT*  Clinical Data: Acute renal failure  RENAL / URINARY TRACT ULTRASOUND  Technique:  Complete ultrasound exam of the kidneys and urinary bladder was performed.  Comparison: No comparison studies available.  Findings:  The right kidney measures 13.6 cm in long axis.  The left kidney measures 13.1 cm.  Bilateral moderate hydronephrosis is evident.  The bladder is markedly distended.  Prevoid volume is calculated at 1250 ml.  Postvoid residual is calculated at 1009 ml.  Impression:  Moderate  bilateral hydronephrosis with substantial postvoid residual in the urinary bladder.   Original Report Authenticated By: Kennith Center, M.D.    I have reviewed the patient's current medications.  Assessment/Plan: Problem #1 acute kidney injury presently seems to be obstructive. His BUN is 56 and creatinine is 4.45 renal function seems to be improving. Presently S/P a Foley catheter  placementwith good urine output. Problem #2 hyperkalemia potassium 5 has improved Problem #3 anemia seems to be secondary to chronic disease. Problem #4 history of BPH Problem #5 history of hypercholesterolemia  Problem #6 history of gout and no recent activation. Problem #7 metabolic bone disease his calcium and phosphorus was in acceptable range. Plan: Continue his hydration. If patient is going to be discharged possibly needs the  Foley catheter until patient is seen by urology. Check his present metabolic panel in the morning.   LOS: 2 days   Tayten Heber S 04/07/2013,8:24 AM

## 2013-04-07 NOTE — Discharge Summary (Signed)
Physician Discharge Summary  Noah Cook ZOX:[id number] DOB: 1950-10-06 DOA: 04/05/2013  PCP: Colette Ribas, MD  Admit date: 04/05/2013 Discharge date: 04/07/2013  Time spent: 40 minutes  Recommendations for Outpatient Follow-up:  1. Patient has appointment with Dr. Hoyle Sauer with urology 04/12/13. 2. Follow up with nephrology Dr. Kristian Covey in 4 weeks  Discharge Diagnoses:  Principal Problem:   Acute renal failure Active Problems:   Gout   Hypercholesteremia   BPH (benign prostatic hyperplasia)   Anemia   Hyperkalemia   Hydronephrosis   Discharge Condition: stable  Diet recommendation: heart healthy   Filed Weights   04/05/13 0909 04/05/13 1209  Weight: 98.884 kg (218 lb) 98.884 kg (218 lb)    History of present illness:  Noah Cook is a very pleasant 62 y.o. male with a past medical history that includes gout, hypercholesterolemia, BPH who presented to the emergency department on 04/05/13 with the chief complaint of abnormal labs. He indicated that he was put on allopurinol  a year ago and ever since then he has routine lab work usually yearly. He stated that he had blood work done 2 days prior and that his primary care provider called him to tell him that "his kidneys were not functioning properly". He was advised to come to the emergency department. He denied any recent dysuria hematuria urgency or flank pain. He did report that he urinates frequently and has some hesitancy with initiating flow that he attributes to an enlarged prostate. He also indicated that when he was put on allopurinol his primary care provider told him to "drink plenty of water". He stated that since that time he has made a conscious effort to drink water. He denied any fever chills nausea vomiting or sick contacts. He did indicate that he had a headache for several days  but he took Tylenol and resolved. He denied any chest pain palpitations shortness of breath or cough. He indicated that he had  not had a gout attack since she's been put on the allopurinol and prior to that time he would have severe gout attacks in multiple joints every 2 months. Initial evaluation in the emergency department yielded a hemoglobin of 10.2 potassium of 5.3 creatinine of 5.16 and a BUN of 72. EKG yielded normal sinus rhythm.     Hospital Course:  Acute renal failure: likely related to obstructive uropathy secondary to BPH. Renal US yielded moderate bilateral hydronephrosis and substantial post void residual. Foley provided with immediate output of large amounts clear urine. Hydrated with IV fluids. Creatinine trended down slowly. He was evaluated by nephrology who opined obstructive uropathy due to BPH. Will follow up with Dr. Kristian Covey in 4 weeks  Active Problems:  BPH: Will need OP urology work up. Has an appointment with Dr. Hoyle Sauer in [facility] 04/12/13. Will be discharged with foley catheter. Flomax started 04/06/13.   Hyperkalemia: Likely related to #1. Resolved s/p kayexalate.   Anemia:normocytic. Denies melena or BRBPR. FOBT negative. Anemia panel yields TIBC 214, ferritin 399, RBC 3.45. Likely of chronic disease. Recommend OP follow up with PCP  Gout: Stable at baseline. Patient indicates he has not had a gout flare since he's been on allopurinol. Continue to hold allopurinol until renal function improves.   Hypercholesteremia: Resume statin at discharge. Lipid panel unremarkable. LFT's unremarkable     Procedures:  none  Consultations:  Dr. Kristian Covey nephrology  Discharge Exam: Filed Vitals:   04/07/13 0522  BP: 147/72  Pulse: 67  Temp: 98.2 F (36.8 C)  Resp: 20    General: well nourished NAD Cardiovascular: RRR No MGR No LE edema Respiratory: Normal effort BS clear bilaterally no wheeze no rhonchi Abdomen: soft +BS non-tender to palpation. Foley draining slightly pink clear urine.   Discharge Instructions     Medication List    STOP taking these medications        allopurinol 300 MG tablet  Commonly known as:  ZYLOPRIM      TAKE these medications       HYDROcodone-acetaminophen 5-325 MG per tablet  Commonly known as:  NORCO/VICODIN  Take 1-2 tablets by mouth every 4 (four) hours as needed.     simvastatin 20 MG tablet  Commonly known as:  ZOCOR  Take 20 mg by mouth daily.     tamsulosin 0.4 MG Caps capsule  Commonly known as:  FLOMAX  Take 1 capsule (0.4 mg total) by mouth daily.       No Known Allergies     Follow-up Information   Follow up with [facility] S, MD In 4 weeks.   Specialty:  Nephrology   Contact information:   [address] [place] [address] [phone]       Follow up with Antony Haste, MD On 04/12/2013.   Specialty:  Urology   Contact information:   [address] [place] [address] [phone]        The results of significant diagnostics from this hospitalization (including imaging, microbiology, ancillary and laboratory) are listed below for reference.    Significant Diagnostic Studies: US Renal  04/05/2013   *RADIOLOGY REPORT*  Clinical Data: Acute renal failure  RENAL / URINARY TRACT ULTRASOUND  Technique:  Complete ultrasound exam of the kidneys and urinary bladder was performed.  Comparison: No comparison studies available.  Findings:  The right kidney measures 13.6 cm in long axis.  The left kidney measures 13.1 cm.  Bilateral moderate hydronephrosis is evident.  The bladder is markedly distended.  Prevoid volume is calculated at 1250 ml.  Postvoid residual is calculated at 1009 ml.  Impression:  Moderate bilateral hydronephrosis with substantial postvoid residual in the urinary bladder.   Original Report Authenticated By: Kennith Center, M.D.    Microbiology: No results found for this or any previous visit (from the past 240 hour(s)).   Labs: Basic Metabolic Panel:  Recent Labs Lab 04/05/13 0924 04/06/13 0546 04/07/13 0552  NA 140 140 141  K  5.3* 4.6 5.0  CL 107 108 109  CO2 21 21 24   GLUCOSE 117* 121* 125*  BUN 72* 62* 56*  CREATININE 5.16* 5.01* 4.45*  CALCIUM 9.8 9.3 9.4  PHOS  --   --  4.6   Liver Function Tests:  Recent Labs Lab 04/05/13 1100  AST 17  ALT 10  ALKPHOS 49  BILITOT 0.5  PROT 7.3  ALBUMIN 4.5   No results found for this basename: LIPASE, AMYLASE,  in the last 168 hours No results found for this basename: AMMONIA,  in the last 168 hours CBC:  Recent Labs Lab 04/05/13 0924 04/06/13 0546 04/07/13 0552  WBC 6.7 6.6 7.5  NEUTROABS 4.7  --   --   HGB 10.2* 9.3* 10.3*  HCT 30.9* 27.9* 31.1*  MCV 88.0 88.9 88.6  PLT 204 187 191   Cardiac Enzymes:  Recent Labs Lab 04/07/13 0801  CKTOTAL 78   BNP: BNP (last 3 results) No results found for this basename: PROBNP,  in the last 8760 hours CBG: No  results found for this basename: GLUCAP,  in the last 168 hours     Signed:  Gwenyth Bender  [facility] 04/07/2013, 12:13 PM

## 2013-04-08 LAB — BASIC METABOLIC PANEL
BUN: 51 mg/dL — ABNORMAL HIGH (ref 6–23)
BUN: 50 mg/dL — ABNORMAL HIGH (ref 6–23)
CO2: 25 mEq/L (ref 19–32)
Chloride: 108 mEq/L (ref 96–112)
Chloride: 100 mEq/L (ref 96–112)
Creatinine, Ser: 3.48 mg/dL — ABNORMAL HIGH (ref 0.50–1.35)
GFR calc Af Amer: 18 mL/min — ABNORMAL LOW (ref >90)
Glucose, Bld: 119 mg/dL — ABNORMAL HIGH (ref 70–99)
Potassium: 4.6 mEq/L (ref 3.5–5.1)
Potassium: 4.3 mEq/L (ref 3.5–5.1)
Sodium: 141 mEq/L (ref 135–145)

## 2013-04-08 NOTE — Discharge Summary (Signed)
Physician Discharge Summary  Noah Cook:[id number] DOB: 18-Jun-1951 DOA: 04/05/2013  PCP: Colette Ribas, MD  Admit date: 04/05/2013 Discharge date: 04/08/2013  Time spent: 25 minutes  Recommendations for Outpatient Follow-up:  1. Patient has appointment with Dr. Hoyle Sauer with urology 04/12/13.  2. Follow up with nephrology Dr. Kristian Covey in 2 weeks  Discharge Diagnoses:  Principal Problem:  Acute renal failure  Active Problems:  Gout  Hypercholesteremia  BPH (benign prostatic hyperplasia)  Anemia  Hyperkalemia  Hydronephrosis   Discharge Condition: stable  Diet recommendation: heart healthy   Filed Weights   04/05/13 0909 04/05/13 1209  Weight: 98.884 kg (218 lb) 98.884 kg (218 lb)    History of present illness:  Noah Cook is a very pleasant 62 y.o. male with a past medical history that includes gout, hypercholesterolemia, BPH who presented to the emergency department on 04/05/13 with the chief complaint of abnormal labs. He indicated that he was put on allopurinol a year ago and ever since then he has routine lab work usually yearly. He stated that he had blood work done 2 days prior and that his primary care provider called him to tell him that "his kidneys were not functioning properly". He was advised to come to the emergency department. He denied any recent dysuria hematuria urgency or flank pain. He did report that he urinates frequently and has some hesitancy with initiating flow that he attributes to an enlarged prostate. He also indicated that when he was put on allopurinol his primary care provider told him to "drink plenty of water". He stated that since that time he has made a conscious effort to drink water. He denied any fever chills nausea vomiting or sick contacts. Initial evaluation in the emergency department yielded a hemoglobin of 10.2 potassium of 5.3 creatinine of 5.16 and a BUN of 72. EKG yielded normal sinus rhythm.   Hospital Course:  Acute  renal failure: likely related to obstructive uropathy secondary to BPH. Renal US yielded moderate bilateral hydronephrosis and substantial post void residual. Foley provided with immediate output of large amounts clear urine. Hydrated with IV fluids. Creatinine trended down slowly. By day of discharge, his creatinine was at  3.78 in the morning & then down to 3.49 by evening.  Pt at this point would like to very much go home.  Given his compliance, ability to follow directions (advised patient to drink water >1 lit daily), family support, foley placement 7 otherwise healthy kidneys, i am ok with this plan.    He was evaluated by nephrology who opined obstructive uropathy due to BPH. Will follow up with Dr. Kristian Covey in 2 weeks   Active Problems:  BPH: Will need OP urology work up. Has an appointment with Dr. Hoyle Sauer in [facility] 04/12/13. Will be discharged with foley catheter. Flomax started 04/06/13.   Hyperkalemia: Likely related to #1. Resolved s/p kayexalate.   Anemia:normocytic. Denies melena or BRBPR. FOBT negative. Anemia panel yields TIBC 214, ferritin 399, RBC 3.45. Likely of chronic disease. Recommend OP follow up with PCP   Gout: Stable at baseline. Patient indicates he has not had a gout flare since he's been on allopurinol. Continue to hold allopurinol until renal function improves, he has been instructed to restart this medicine only after he has seen nephrology as an outpatient.   Hypercholesteremia: Resume statin at discharge. Lipid panel unremarkable. LFT's unremarkable   Procedures:  None  Consultations:  Dr. Kristian Covey nephrology  Discharge Exam: Filed Vitals:   04/08/13 0535  BP: 129/69  Pulse: 63  Temp: 98.3 F (36.8 C)  Resp: 18    General: Alert and oriented x3, no acute distress Cardiovascular: Regular rate and rhythm, S1-S2 Respiratory: Clear to auscultation bilaterally Abdomen: Soft, nontender, nondistended, positive bowel sounds Extremities: No clubbing or  cyanosis or edema  Discharge Instructions  Discharge Orders   Future Orders Complete By Expires   Diet - low sodium heart healthy  As directed    Discharge instructions  As directed    Comments:     Restart allopurinol after you see kidney doctors   Increase activity slowly  As directed        Medication List    STOP taking these medications       allopurinol 300 MG tablet  Commonly known as:  ZYLOPRIM      TAKE these medications       HYDROcodone-acetaminophen 5-325 MG per tablet  Commonly known as:  NORCO/VICODIN  Take 1-2 tablets by mouth every 4 (four) hours as needed.     simvastatin 20 MG tablet  Commonly known as:  ZOCOR  Take 20 mg by mouth daily.     tamsulosin 0.4 MG Caps capsule  Commonly known as:  FLOMAX  Take 1 capsule (0.4 mg total) by mouth daily.       No Known Allergies     Follow-up Information   Follow up with [facility] S, MD In 2 weeks.   Specialty:  Nephrology   Contact information:   [address] [place] [address] [phone]       Follow up with Antony Haste, MD On 04/12/2013.   Specialty:  Urology   Contact information:   [address] [place] [address] [phone]       Follow up with Colette Ribas, MD In 1 month.   Specialty:  Family Medicine   Contact information:   [address] [place] [address] [phone]        The results of significant diagnostics from this hospitalization (including imaging, microbiology, ancillary and laboratory) are listed below for reference.    Significant Diagnostic Studies: US Renal  04/05/2013   *RADIOLOGY REPORT*  Clinical Data: Acute renal failure  RENAL / URINARY TRACT ULTRASOUND  Technique:  Complete ultrasound exam of the kidneys and urinary bladder was performed.  Comparison: No comparison studies available.  Findings:  The right kidney measures 13.6 cm in long axis.  The left kidney measures  13.1 cm.  Bilateral moderate hydronephrosis is evident.  The bladder is markedly distended.  Prevoid volume is calculated at 1250 ml.  Postvoid residual is calculated at 1009 ml.  Impression:  Moderate bilateral hydronephrosis with substantial postvoid residual in the urinary bladder.   Original Report Authenticated By: Kennith Center, M.D.    Microbiology: No results found for this or any previous visit (from the past 240 hour(s)).   Labs: Basic Metabolic Panel:  Recent Labs Lab 04/05/13 0924 04/06/13 0546 04/07/13 0552 04/07/13 1640 04/08/13 0524  NA 140 140 141 140 141  K 5.3* 4.6 5.0 4.7 4.6  CL 107 108 109 107 108  CO2 21 21 24 24 25   GLUCOSE 117* 121* 125* 112* 119*  BUN 72* 62* 56* 53* 51*  CREATININE 5.16* 5.01* 4.45* 4.08* 3.78*  CALCIUM 9.8 9.3 9.4 9.0 9.2  PHOS  --   --  4.6  --   --    Liver Function Tests:  Recent Labs Lab 04/05/13 1100  AST 17  ALT 10  ALKPHOS 49  BILITOT 0.5  PROT 7.3  ALBUMIN 4.5   CBC:  Recent Labs Lab 04/05/13 0924 04/06/13 0546 04/07/13 0552  WBC 6.7 6.6 7.5  NEUTROABS 4.7  --   --   HGB 10.2* 9.3* 10.3*  HCT 30.9* 27.9* 31.1*  MCV 88.0 88.9 88.6  PLT 204 187 191   Cardiac Enzymes:  Recent Labs Lab 04/07/13 0801  CKTOTAL 78     Signed:  Josefita Weissmann K  [facility] 04/08/2013, 10:06 AM

## 2013-04-08 NOTE — Progress Notes (Signed)
Noah Cook  MRN: [id number]  DOB/AGE: January 05, 1951 62 y.o.  Primary Care Physician:GOLDING, Chancy Hurter, MD  Admit date: 04/05/2013  Chief Complaint:  Chief Complaint  Patient presents with  . Abnormal Lab    S-Pt presented on  04/05/2013 with  Chief Complaint  Patient presents with  . Abnormal Lab  .    Pt today feels better    Pt offers no specific complaints.  Meds . heparin  5,000 Units Subcutaneous Q8H  . sodium chloride  3 mL Intravenous Q12H  . tamsulosin  0.4 mg Oral Daily      Physical Exam: Vital signs in last 24 hours: Temp:  [97.1 F (36.2 C)-98.5 F (36.9 C)] 98.3 F (36.8 C) (09/20 0535) Pulse Rate:  [63-65] 63 (09/20 0535) Resp:  [18-20] 18 (09/20 0535) BP: (129-135)/(69-78) 129/69 mmHg (09/20 0535) SpO2:  [99 %-100 %] 100 % (09/20 0535) Weight change:  Last BM Date: 04/07/13  Intake/Output from previous day: 09/19 0701 - 09/20 0700 In: 3791.7 [P.O.:1020; I.V.:2771.7] Out: 5700 [Urine:5700]     Physical Exam: General- pt is awake,alert, oriented to time place and person Resp- No acute REsp distress, CTA B/L NO Rhonchi CVS- S1S2 regular in rate and rhythm GIT- BS+, soft, NT, ND EXT- NO LE Edema, Cyanosis   Lab Results: CBC  Recent Labs  04/06/13 0546 04/07/13 0552  WBC 6.6 7.5  HGB 9.3* 10.3*  HCT 27.9* 31.1*  PLT 187 191    BMET  Recent Labs  04/07/13 1640 04/08/13 0524  NA 140 141  K 4.7 4.6  CL 107 108  CO2 24 25  GLUCOSE 112* 119*  BUN 53* 51*  CREATININE 4.08* 3.78*  CALCIUM 9.0 9.2   Creat Trend 2014 5.16==>3.78  Potassium 5.3==>4.6  Lab Results  Component Value Date   CALCIUM 9.2 04/08/2013   PHOS 4.6 04/07/2013               Impression: 1)Renal  AKI secondary to Post renal obstruction  Pt with B/L hydro and large post voidal residual. S/p Catheter placement Creat improving    2) CVS- BP stable  3)Anemia HGb at goal (9--11)   4)CKD Mineral-Bone Disorder Phosphorus at  goal. Calcium at goal  5)Urology- BPH Follow up planned as outpt.  6)ELectrolytes  Normokalemic- was hyperkalemia earlier  NOrmonatremic   7)Acid base Co2 at goal     Plan:  Will continue current care. Pt will benefit from nephrology appt as outpt.       BHUTANI,MANPREET S 04/08/2013, 9:04 AM

## 2013-04-08 NOTE — Progress Notes (Signed)
Pt is to be discharged home today. Pt is in NAD, IV is out, all paperwork has been reviewed/discussed with patient, and there are no questions/concerns at this time. Assessment is unchanged from this morning. Pt is to be accompanied downstairs by staff and family via wheelchair.  

## 2013-06-01 ENCOUNTER — Other Ambulatory Visit: Payer: Self-pay | Admitting: Urology

## 2013-06-26 ENCOUNTER — Encounter (HOSPITAL_COMMUNITY): Payer: Self-pay | Admitting: Pharmacy Technician

## 2013-06-27 NOTE — Patient Instructions (Addendum)
20      Your procedure is scheduled on:  Friday 06/30/2013  Report to [facility] at 0830 AM.  Call this number if you have problems the night before or morning of surgery:  [phone]   Remember:             Do not eat food or drink liquids AFTER MIDNIGHT!  Take these medicines the morning of surgery with A SIP OF WATER: simvastatin, flomax    [facility] IS NOT RESPONSIBLE FOR ANY BELONGINGS OR VALUABLES BROUGHT TO HOSPITAL.  Marland Kitchen  Leave suitcase in the car. After surgery it may be brought to your room.  For patients admitted to the hospital, checkout time is 11:00 AM the day of              Discharge.    DO NOT WEAR JEWELRY,MAKE-UP,LOTIONS,POWDERS,PERFUMES,CONTACTS , DENTURES OR BRIDGEWORK ,AND DO NOT WEAR FALSE EYELASHES                                    Patients discharged the day of surgery will not be allowed to drive home.  If going home the same day of surgery, must have someone stay with you  first 24 hrs.at home and arrange for someone to drive you home from the Hospital.                          Special Instructions:              Please read over the following fact sheets that you were given:             1. [facility] [facility] PREPARING FOR SURGERY SHEET                             Randolm Idol     [phone]                FAILURE TO FOLLOW THESE INSTRUCTIONS MAY RESULT IN CANCELLATION OF YOUR SURGERY!               Patient Signature:___________________________

## 2013-06-28 ENCOUNTER — Encounter (HOSPITAL_COMMUNITY)
Admission: RE | Admit: 2013-06-28 | Discharge: 2013-06-28 | Disposition: A | Discharge status: Home / Self Care | Payer: BC Managed Care – PPO | Source: Ambulatory Visit | Attending: Urology | Admitting: Urology

## 2013-06-28 ENCOUNTER — Encounter (HOSPITAL_COMMUNITY): Payer: Self-pay

## 2013-06-28 HISTORY — DX: Retention of urine, unspecified: R33.9

## 2013-06-28 LAB — URINALYSIS, ROUTINE W REFLEX MICROSCOPIC
Bilirubin Urine: NEGATIVE
Glucose, UA: NEGATIVE mg/dL
Ketones, ur: NEGATIVE mg/dL
Nitrite: NEGATIVE
Specific Gravity, Urine: 1.004 — ABNORMAL LOW (ref 1.005–1.030)
pH: 6.5 (ref 5.0–8.0)

## 2013-06-28 LAB — CBC
HCT: 38.2 % — ABNORMAL LOW (ref 39.0–52.0)
MCH: 29.6 pg (ref 26.0–34.0)
MCV: 87.6 fL (ref 78.0–100.0)
Platelets: 207 10*3/uL (ref 150–400)
RBC: 4.36 MIL/uL (ref 4.22–5.81)
WBC: 8.5 10*3/uL (ref 4.0–10.5)

## 2013-06-28 LAB — BASIC METABOLIC PANEL
CO2: 25 mEq/L (ref 19–32)
Calcium: 9.6 mg/dL (ref 8.4–10.5)
Chloride: 101 mEq/L (ref 96–112)
Glucose, Bld: 117 mg/dL — ABNORMAL HIGH (ref 70–99)
Sodium: 136 mEq/L (ref 135–145)

## 2013-06-28 LAB — URINE MICROSCOPIC-ADD ON

## 2013-06-28 NOTE — Progress Notes (Signed)
ekg 04-05-13 epic

## 2013-06-28 NOTE — Progress Notes (Signed)
Micro, ua results faxed to dr [email or web address] by epic

## 2013-06-28 NOTE — Progress Notes (Signed)
bmet results faxed by epic to [email or web address] 

## 2013-06-29 MED ORDER — DEXTROSE 5 % IV SOLN
2.0000 g | Freq: Once | INTRAVENOUS | Status: AC
  Administered 2013-06-30: 2 g via INTRAVENOUS
  Filled 2013-06-29 (×2): qty 2

## 2013-06-29 NOTE — Interval H&P Note (Signed)
History and Physical Interval Note:  06/29/2013 5:05 PM  Urine Cx growing 10K GNR which is a pretty low count giving a chronic foley. Also, his WBC is normal, indicating no infection.    Noah Cook

## 2013-06-29 NOTE — H&P (Signed)
History of Present Illness            F/u BPH, acute urinary retention, bilateral hydro and ARF. Seen by nephrology Dr. Kristian Covey and primary care Dr. Hassan Rowan    1-BPH - Sept 2014 voiding with weak stream, hesitancy and urinary frequency and nocturia x 4. Renal/bladder U/S - 50 grams. Normal DRE, added finasteride to tamsulosin    2-Acute urinary retention - Sept 2014 foley placed - a prevoid volume of 1250 and a postvoid volume of 1000 cc. A Foley catheter was placed with a large amount of urine output.   -November 2014 urodynamics - revealed a decreased capacity and unstable bladder. There was no incontinence. The voiding pattern was obstructed with an elevated postvoid. There was a good bladder contraction. There was no vesicoureteral reflux. Cystogram shows a diverticulum at the dome of the bladder or possibly a colovesical fistula.     3- bilateral hydronephrosis - Sept 2014 renal U/S - A renal ultrasound revealed moderate bilateral hydroureteronephrosis and a prevoid volume of 1250 and a postvoid volume of 1000 cc. A Foley catheter was placed with a large amount of urine output.   -Oct 2014 Renal ultrasound today-I reviewed on the images, comparison Sept 2014 u/s - there is continued right hydronephrosis but it is mild and decreased compared to the ultrasound September 2014. There is a round cyst in the right upper pole 1.7 cm and a round cyst/hypoechoic area in the right lower pole 2.6 cm. Again seen was a 1.35 cm echogenic shadowing focus in the right lower pole. This may be a stone. It raises the question he could have a stone in the ureter. The left kidney showed no hydronephrosis which I improved since the Foley was placed. The bladder was empty with a balloon noted in the bladder.  -Oct 2014 CT A/P - shows a large right lower pole stone which is stable since 2004 as well as what appears to be a right UPJ obstruction. This is also stable going back to 2004. The bladder is thick  walled and the prostate median lobe is enlarged .    4- ARF - Set 2014 BUN 72, Cr 5.16, foley placed, repeat Cr 3.49.   -Sept 2014 bun 58, cr 2.87 with foley      Oct 2014 Interval hx  He returns on combination therapy. He has a Foley catheter. He is admitted for fevers or bladder pain.       Past Medical History Problems  1. History of Arthritis (V13.4) 2. History of Gout (274.9) 3. History of hypercholesterolemia (V12.29) 4. History of Nonfunctioning kidney (593.9)  Surgical History Problems  1. History of No Surgical Problems  Current Meds 1. Allopurinol 300 MG Oral Tablet;  Therapy: (Recorded:24Sep2014) to Recorded 2. Finasteride 5 MG Oral Tablet; Take 1 tablet by mouth every day;  Therapy: 24Sep2014 to (Evaluate:19Sep2015)  Requested for: 24Sep2014; Last  Rx:24Sep2014 Ordered 3. Hydrocodone-Acetaminophen 5-325 MG Oral Tablet;  Therapy: 20Sep2014 to Recorded 4. Simvastatin 20 MG Oral Tablet;  Therapy: (Recorded:24Sep2014) to Recorded 5. Tamsulosin HCl - 0.4 MG Oral Capsule; Take one capsule daily;  Therapy: 21Oct2014 to (Evaluate:16Oct2015)  Requested for: 23Oct2014; Last  Rx:21Oct2014 Ordered 6. Tamsulosin HCl - 0.4 MG Oral Capsule;  Therapy: (Recorded:24Sep2014) to Recorded  Allergies Medication  1. No Known Drug Allergies  Family History Problems  1. Family history of Acute Myocardial Infarction (V17.3) : Father 2. Family history of Colon Cancer (V16.0) 3. Family history of Death In The Family Father 4.  Family history of Diabetes Mellitus (V18.0) 5. Family history of Family Health Status Number Of Children   1 son 1 daughter 14. Family history of Family Health Status Of Mother - Alive 7. Family history of Heart Disease (V17.49)  Social History Problems  1. Denied: History of Alcohol Use 2. Caffeine Use   1 per day 3. Marital History - Currently Married 4. Never A Smoker 5. Retired From Work  Review of [employer],  constitutional, skin, eye, otolaryngeal, hematologic/lymphatic, cardiovascular, pulmonary, endocrine, musculoskeletal, gastrointestinal, neurological and psychiatric system(s) were reviewed and pertinent findings if present are noted.    Vitals Vital Signs [Data Includes: Last 1 Day]  Recorded: 12Nov2014 09:39AM  Blood Pressure: 154 / 78 Temperature: 97.5 F Heart Rate: 62  Physical Exam Constitutional: Well nourished and well developed . No acute distress.  Pulmonary: No respiratory distress and normal respiratory rhythm and effort.  Cardiovascular: Heart rate and rhythm are normal . No peripheral edema.  Neuro/Psych:. Mood and affect are appropriate.    Assessment Assessed  1. Benign localized prostatic hyperplasia with lower urinary tract symptoms (LUTS)  (600.21,599.69) 2. Urinary retention (788.20)  End of Encounter Meds  Medication Name Instruction  Ciprofloxacin HCl - 250 MG Oral Tablet Take 1 tablet twice daily  Finasteride 5 MG Oral Tablet Take 1 tablet by mouth every day  Hydrocodone-Acetaminophen 5-325 MG Oral Tablet   Simvastatin 20 MG Oral Tablet   Tamsulosin HCl - 0.4 MG Oral Capsule Take one capsule daily  Tamsulosin HCl - 0.4 MG Oral Capsule    Plan Benign localized prostatic hyperplasia with lower urinary tract symptoms (LUTS)  1. Start: Ciprofloxacin HCl - 250 MG Oral Tablet; Take 1 tablet twice daily Urinary retention  2. Follow-up Schedule Surgery Office  Follow-up  Status: Hold For - Appointment   Requested for: 12Nov2014 Unlinked  3. Stop: Allopurinol 300 MG Oral Tablet  Discussion/Summary Using the understanding prostate problems booklet we discussed the anatomy and the nature risks benefits and alternatives to greenlight photo vaporization of the prostate. We discussed on occasion we have to switch back to a loop and perform resection. We discussed the differences in these techniques. We discussed alternatives such as continuing medications, continuing  the Foley or learning CIC. All questions answered and he elects to proceed with outlet procedure. Discuss CT results and right UPJ obstruction and right lower pole stone. Discussed question of colovesical fistula. Patient's last colonoscopy was 7 years ago. Discussed the bacteria in urine not uncommon with a Foley but we will treat this before surgery. Start Cipro 3 days before surgery, given written instructions - renal dosing. We will perform a cystogram along with the greenlight.       Signatures Electronically signed by : Jerilee Field, M.D.; May 31 2013 11:06AM EST

## 2013-06-30 ENCOUNTER — Ambulatory Visit (HOSPITAL_COMMUNITY): Payer: BC Managed Care – PPO | Admitting: Certified Registered"

## 2013-06-30 ENCOUNTER — Encounter (HOSPITAL_COMMUNITY): Payer: BC Managed Care – PPO | Admitting: Certified Registered"

## 2013-06-30 ENCOUNTER — Encounter (HOSPITAL_COMMUNITY): Payer: Self-pay | Admitting: *Deleted

## 2013-06-30 ENCOUNTER — Ambulatory Visit (HOSPITAL_COMMUNITY)
Admission: RE | Admit: 2013-06-30 | Discharge: 2013-06-30 | Disposition: A | Discharge status: Home / Self Care | Payer: BC Managed Care – PPO | Source: Ambulatory Visit | Attending: Urology | Admitting: Urology

## 2013-06-30 ENCOUNTER — Encounter (HOSPITAL_COMMUNITY)
Admission: RE | Disposition: A | Discharge status: Home / Self Care | Payer: Self-pay | Source: Ambulatory Visit | Attending: Urology

## 2013-06-30 ENCOUNTER — Ambulatory Visit (HOSPITAL_COMMUNITY): Payer: BC Managed Care – PPO

## 2013-06-30 DIAGNOSIS — R350 Frequency of micturition: Secondary | ICD-10-CM | POA: Insufficient documentation

## 2013-06-30 DIAGNOSIS — N138 Other obstructive and reflux uropathy: Secondary | ICD-10-CM | POA: Insufficient documentation

## 2013-06-30 DIAGNOSIS — N361 Urethral diverticulum: Secondary | ICD-10-CM | POA: Insufficient documentation

## 2013-06-30 DIAGNOSIS — N179 Acute kidney failure, unspecified: Secondary | ICD-10-CM | POA: Insufficient documentation

## 2013-06-30 DIAGNOSIS — Z79899 Other long term (current) drug therapy: Secondary | ICD-10-CM | POA: Insufficient documentation

## 2013-06-30 DIAGNOSIS — E119 Type 2 diabetes mellitus without complications: Secondary | ICD-10-CM | POA: Insufficient documentation

## 2013-06-30 DIAGNOSIS — E7800 Pure hypercholesterolemia, unspecified: Secondary | ICD-10-CM | POA: Insufficient documentation

## 2013-06-30 DIAGNOSIS — N1330 Unspecified hydronephrosis: Secondary | ICD-10-CM | POA: Insufficient documentation

## 2013-06-30 DIAGNOSIS — N401 Enlarged prostate with lower urinary tract symptoms: Secondary | ICD-10-CM | POA: Insufficient documentation

## 2013-06-30 DIAGNOSIS — R339 Retention of urine, unspecified: Secondary | ICD-10-CM | POA: Insufficient documentation

## 2013-06-30 DIAGNOSIS — Z01812 Encounter for preprocedural laboratory examination: Secondary | ICD-10-CM | POA: Insufficient documentation

## 2013-06-30 DIAGNOSIS — N323 Diverticulum of bladder: Secondary | ICD-10-CM | POA: Insufficient documentation

## 2013-06-30 DIAGNOSIS — N281 Cyst of kidney, acquired: Secondary | ICD-10-CM | POA: Insufficient documentation

## 2013-06-30 DIAGNOSIS — R351 Nocturia: Secondary | ICD-10-CM | POA: Insufficient documentation

## 2013-06-30 HISTORY — PX: GREEN LIGHT LASER TURP (TRANSURETHRAL RESECTION OF PROSTATE: SHX6260

## 2013-06-30 LAB — URINE CULTURE: Colony Count: 10000

## 2013-06-30 SURGERY — GREEN LIGHT LASER TURP (TRANSURETHRAL RESECTION OF PROSTATE
Anesthesia: General | Site: Prostate

## 2013-06-30 MED ORDER — PROPOFOL 10 MG/ML IV BOLUS
INTRAVENOUS | Status: AC
  Filled 2013-06-30: qty 20

## 2013-06-30 MED ORDER — PROPOFOL 10 MG/ML IV BOLUS
INTRAVENOUS | Status: DC | PRN
  Administered 2013-06-30: 180 mg via INTRAVENOUS

## 2013-06-30 MED ORDER — MIDAZOLAM HCL 5 MG/5ML IJ SOLN
INTRAMUSCULAR | Status: DC | PRN
  Administered 2013-06-30 (×2): 1 mg via INTRAVENOUS

## 2013-06-30 MED ORDER — LACTATED RINGERS IV SOLN
INTRAVENOUS | Status: DC
  Administered 2013-06-30 (×4): via INTRAVENOUS

## 2013-06-30 MED ORDER — EPHEDRINE SULFATE 50 MG/ML IJ SOLN
INTRAMUSCULAR | Status: DC | PRN
  Administered 2013-06-30 (×2): 5 mg via INTRAVENOUS

## 2013-06-30 MED ORDER — LIDOCAINE HCL 2 % EX GEL
CUTANEOUS | Status: AC
  Filled 2013-06-30: qty 10

## 2013-06-30 MED ORDER — FENTANYL CITRATE 0.05 MG/ML IJ SOLN
INTRAMUSCULAR | Status: AC
  Filled 2013-06-30: qty 2

## 2013-06-30 MED ORDER — PROMETHAZINE HCL 25 MG/ML IJ SOLN: 6.2500 mg | INTRAMUSCULAR | Status: DC | PRN

## 2013-06-30 MED ORDER — BELLADONNA ALKALOIDS-OPIUM 16.2-60 MG RE SUPP
RECTAL | Status: AC
  Filled 2013-06-30: qty 1

## 2013-06-30 MED ORDER — FENTANYL CITRATE 0.05 MG/ML IJ SOLN
INTRAMUSCULAR | Status: DC | PRN
  Administered 2013-06-30 (×2): 25 ug via INTRAVENOUS
  Administered 2013-06-30: 50 ug via INTRAVENOUS

## 2013-06-30 MED ORDER — LIDOCAINE HCL (CARDIAC) 20 MG/ML IV SOLN
INTRAVENOUS | Status: AC
  Filled 2013-06-30: qty 5

## 2013-06-30 MED ORDER — ONDANSETRON HCL 4 MG/2ML IJ SOLN
INTRAMUSCULAR | Status: AC
  Filled 2013-06-30: qty 2

## 2013-06-30 MED ORDER — SODIUM CHLORIDE 0.9 % IR SOLN
Status: DC | PRN
  Administered 2013-06-30: 18000 mL

## 2013-06-30 MED ORDER — DIATRIZOATE MEGLUMINE 30 % UR SOLN
URETHRAL | Status: DC | PRN
  Administered 2013-06-30: 300 mL via URETHRAL

## 2013-06-30 MED ORDER — MIDAZOLAM HCL 2 MG/2ML IJ SOLN
INTRAMUSCULAR | Status: AC
  Filled 2013-06-30: qty 2

## 2013-06-30 MED ORDER — LIDOCAINE HCL (CARDIAC) 20 MG/ML IV SOLN
INTRAVENOUS | Status: DC | PRN
  Administered 2013-06-30: 50 mg via INTRAVENOUS

## 2013-06-30 MED ORDER — HYDROMORPHONE HCL PF 1 MG/ML IJ SOLN
INTRAMUSCULAR | Status: AC
  Filled 2013-06-30: qty 1

## 2013-06-30 MED ORDER — BELLADONNA ALKALOIDS-OPIUM 16.2-60 MG RE SUPP
RECTAL | Status: DC | PRN
  Administered 2013-06-30: 1 via RECTAL

## 2013-06-30 MED ORDER — EPHEDRINE SULFATE 50 MG/ML IJ SOLN
INTRAMUSCULAR | Status: AC
  Filled 2013-06-30: qty 1

## 2013-06-30 MED ORDER — HYDROMORPHONE HCL PF 1 MG/ML IJ SOLN
0.2500 mg | INTRAMUSCULAR | Status: DC | PRN
  Administered 2013-06-30 (×2): 0.5 mg via INTRAVENOUS

## 2013-06-30 MED ORDER — LACTATED RINGERS IV SOLN: INTRAVENOUS | Status: DC

## 2013-06-30 MED ORDER — ONDANSETRON HCL 4 MG/2ML IJ SOLN
INTRAMUSCULAR | Status: DC | PRN
  Administered 2013-06-30: 4 mg via INTRAVENOUS

## 2013-06-30 SURGICAL SUPPLY — 23 items
BAG URINE DRAINAGE (UROLOGICAL SUPPLIES) ×3 IMPLANT
BAG URO CATCHER STRL LF (DRAPE) ×3 IMPLANT
CATH FOLEY 2WAY 5CC 20FR (CATHETERS) ×2 IMPLANT
CATH FOLEY 3WAY 30CC 22FR (CATHETERS) ×1 IMPLANT
CATH TIEMANN FOLEY 18FR 5CC (CATHETERS) IMPLANT
DRAPE CAMERA CLOSED 9X96 (DRAPES) ×3 IMPLANT
ELECT BUTTON HF 24-28F 2 30DE (ELECTRODE) ×1 IMPLANT
ELECT LOOP MED HF 24F 12D (CUTTING LOOP) IMPLANT
ELECT LOOP MED HF 24F 12D CBL (CLIP) ×1 IMPLANT
ELECT RESECT VAPORIZE 12D CBL (ELECTRODE) IMPLANT
EVACUATOR MICROVAS BLADDER (UROLOGICAL SUPPLIES) ×1 IMPLANT
FEE RENTAL LASER GREENLIGHT (Laser) ×1 IMPLANT
GLOVE BIOGEL M STRL SZ7.5 (GLOVE) ×7 IMPLANT
GOWN STRL REIN XL XLG (GOWN DISPOSABLE) ×3 IMPLANT
HOLDER FOLEY CATH W/STRAP (MISCELLANEOUS) IMPLANT
IV NS IRRIG 3000ML ARTHROMATIC (IV SOLUTION) ×4 IMPLANT
LASER FIBER /GREENLIGHT LASER (Laser) ×3 IMPLANT
LASER GREENLIGHT RENTAL P/PROC (Laser) ×3 IMPLANT
MANIFOLD NEPTUNE II (INSTRUMENTS) ×3 IMPLANT
PACK CYSTO (CUSTOM PROCEDURE TRAY) ×3 IMPLANT
SYR 30ML LL (SYRINGE) ×2 IMPLANT
SYRINGE IRR TOOMEY STRL 70CC (SYRINGE) IMPLANT
TUBING CONNECTING 10 (TUBING) ×3 IMPLANT

## 2013-06-30 NOTE — Op Note (Signed)
Preop diagnosis: BPH, urinary retention Postop diagnosis: BPH, urinary retention, urethral diverticulum  Procedure: Exam under anesthesia Cystoscopy Greenlight photo vaporization of prostate Cystogram  Surgeon: Mena Goes Anesthesia: Gen.  Findings: On exam under anesthesia the penis was circumcised without lesion. Testicles were descended without mass. On digital rectal exam the prostate was enlarged but smooth without hard area or nodule. On cystoscopy the urethra was normal, the prostatic urethra was elongated with obstructing lateral lobes and a large obstructing median lobe. The bladder mucosa appeared normal without tumor. There was no foreign body or stone in the bladder. The trigone and ureteral orifices were in their normal orthotopic position. There were normal pre-and post lasering without injury. There was moderate trabeculation and cellules. There was an opening in the dome of the bladder that I could not follow with the rigid scope or see for certain that it was blind-ending with the flexible cystoscope.  Cystogram-this outlined the bladder with multiple cellules along the dome and a slightly serpentine diverticulum that appeared blind-ending. There did not appear to be any connection with any other hollow viscus. There was a filling defect at the bladder neck consistent with the Foley balloon.  Description of procedure: After consent was obtained patient brought to the operating room. After adequate anesthesia he was placed in lithotomy position and prepped and draped in the usual sterile fashion. An exam under anesthesia was performed. A cystoscope was passed per urethra and the bladder examined with a 12 and 70 lens. The flexible cystoscope was then passed.  The laser scope was then passed and at a setting of 80 the right lateral lobe was vaporized from anterior to posterior bladder neck down to the area. In a similar fashion the left lateral lobe was vaporized. Again raise the  power 140 and vaporized the lateral lobes again in a similar fashion. I then turned the power back down to 120 and going mostly lateral and a mean medial vaporized the median lobe. A large piece washed out into the bladder. A frequent check the trigone and ureteral orifices and these were never in jeopardy.  This created a nice open channel. There was a large piece of the median lobe that washed into the bladder. This would not irrigate through the laser scope. With the bladder empty hemostasis was excellent there was an excellent channel from the veru looking up. I did not laser past the veru. And kept the veru insight with frequent checks.  The scope was removed and the patient did have a good stream. The cystoscope was passed per urethra and I was able to irrigate out the larger piece of median lobe. It was a vaporized piece of tissue and was not sent to pathology.  A 20 French coud catheter was placed, the balloon inflated and seated at the bladder neck. The catheter irrigated normally. The urine was clear.  The bladder was then filled with 300 cc Cystografin and fluoroscopic images were obtained. This again confirmed a blind-ending diverticulum at the dome of the bladder.  The bladder was drained and connected to gravity drainage. The patient was awakened and taken to recovery room in stable condition.  Complications: None Blood loss: Minimal Specimens: None  Drains: 20 French Foley  Disposition: Patient stable to PACU

## 2013-06-30 NOTE — Anesthesia Postprocedure Evaluation (Signed)
Anesthesia Post Note  Patient: Noah Cook  Procedure(s) Performed: Procedure(s) (LRB): GREEN LIGHT LASER TURP (TRANSURETHRAL RESECTION OF PROSTATE WITH CYSTOGRAM (N/A)  Anesthesia type: General  Patient location: PACU  Post pain: Pain level controlled  Post assessment: Post-op Vital signs reviewed  Last Vitals:  Filed Vitals:   06/30/13 1540  BP: 131/74  Pulse: 70  Temp:   Resp: 16    Post vital signs: Reviewed  Level of consciousness: sedated  Complications: No apparent anesthesia complications

## 2013-06-30 NOTE — Interval H&P Note (Signed)
History and Physical Interval Note:  06/30/2013 11:10 AM  Jerl Mina  has presented today for surgery, with the diagnosis of BPH WITH URINARY RETENTION  The various methods of treatment have been discussed with the patient and family. After consideration of risks, benefits and other options for treatment, the patient has consented to  Procedure(s): GREEN LIGHT LASER TURP (TRANSURETHRAL RESECTION OF PROSTATE POSSIBLE GYRUS WITH CYSTOGRAM (N/A) TRANSURETHRAL RESECTION OF THE PROSTATE WITH GYRUS INSTRUMENTS (N/A) as a surgical intervention .  The patient's history has been reviewed, patient examined, no change in status, stable for surgery.  I have reviewed the patient's chart and labs.  His urine Cx was sensitive to Cipro which he has been taking. He has been well. No fever. Labs stable. I discussed with the patient the nature, potential benefits, risks and alternatives to above procedures, including side effects of the proposed treatment, the likelihood of the patient achieving the goals of the procedure, and any potential problems that might occur during the procedure or recuperation. All questions answered. Patient elects to proceed.     Antony Haste

## 2013-06-30 NOTE — Transfer of Care (Signed)
Immediate Anesthesia Transfer of Care Note  Patient: Noah Cook  Procedure(s) Performed: Procedure(s) (LRB): GREEN LIGHT LASER TURP (TRANSURETHRAL RESECTION OF PROSTATE WITH CYSTOGRAM (N/A)  Patient Location: PACU  Anesthesia Type: General  Level of Consciousness: sedated, patient cooperative and responds to stimulation  Airway & Oxygen Therapy: Patient Spontanous Breathing and Patient connected to face mask oxgen  Post-op Assessment: Report given to PACU RN and Post -op Vital signs reviewed and stable  Post vital signs: Reviewed and stable  Complications: No apparent anesthesia complications

## 2013-06-30 NOTE — Anesthesia Preprocedure Evaluation (Addendum)
Anesthesia Evaluation  Patient identified by MRN, date of birth, ID band Patient awake    Reviewed: Allergy & Precautions, H&P , NPO status , Patient's Chart, lab work & pertinent test results  Airway Mallampati: III TM Distance: >3 FB Neck ROM: Full    Dental  (+) Teeth Intact and Dental Advisory Given   Pulmonary neg pulmonary ROS,  breath sounds clear to auscultation  Pulmonary exam normal       Cardiovascular negative cardio ROS  Rhythm:Regular Rate:Normal     Neuro/Psych negative neurological ROS  negative psych ROS   GI/Hepatic negative GI ROS, Neg liver ROS,   Endo/Other  diabetes  Renal/GU Renal disease  negative genitourinary   Musculoskeletal negative musculoskeletal ROS (+)   Abdominal   Peds negative pediatric ROS (+)  Hematology negative hematology ROS (+) anemia ,   Anesthesia Other Findings   Reproductive/Obstetrics negative OB ROS                           Anesthesia Physical Anesthesia Plan  ASA: II  Anesthesia Plan: General   Post-op Pain Management:    Induction: Intravenous  Airway Management Planned: LMA  Additional Equipment:   Intra-op Plan:   Post-operative Plan: Extubation in OR  Informed Consent: I have reviewed the patients History and Physical, chart, labs and discussed the procedure including the risks, benefits and alternatives for the proposed anesthesia with the patient or authorized representative who has indicated his/her understanding and acceptance.   Dental advisory given  Plan Discussed with: CRNA  Anesthesia Plan Comments:         Anesthesia Quick Evaluation

## 2015-12-25 ENCOUNTER — Telehealth: Payer: Self-pay | Admitting: Gastroenterology

## 2015-12-25 NOTE — Telephone Encounter (Signed)
Pt is on the July recall for a 10 yr colonoscopy

## 2015-12-25 NOTE — Telephone Encounter (Signed)
Letter mailed to pt.  

## 2017-06-21 ENCOUNTER — Other Ambulatory Visit: Payer: Self-pay | Admitting: Urology

## 2017-06-21 DIAGNOSIS — N130 Hydronephrosis with ureteropelvic junction obstruction: Secondary | ICD-10-CM | POA: Diagnosis not present

## 2017-06-21 DIAGNOSIS — R3915 Urgency of urination: Secondary | ICD-10-CM | POA: Diagnosis not present

## 2017-06-21 DIAGNOSIS — Q6239 Other obstructive defects of renal pelvis and ureter: Secondary | ICD-10-CM

## 2017-06-21 DIAGNOSIS — N401 Enlarged prostate with lower urinary tract symptoms: Secondary | ICD-10-CM | POA: Diagnosis not present

## 2017-06-23 ENCOUNTER — Other Ambulatory Visit: Payer: Self-pay | Admitting: Urology

## 2017-06-23 DIAGNOSIS — N135 Crossing vessel and stricture of ureter without hydronephrosis: Secondary | ICD-10-CM

## 2017-07-06 ENCOUNTER — Ambulatory Visit (HOSPITAL_COMMUNITY)
Admission: RE | Admit: 2017-07-06 | Discharge: 2017-07-06 | Disposition: A | Discharge status: Home / Self Care | Payer: PPO | Source: Ambulatory Visit | Attending: Urology | Admitting: Urology

## 2017-07-06 DIAGNOSIS — N135 Crossing vessel and stricture of ureter without hydronephrosis: Secondary | ICD-10-CM | POA: Diagnosis not present

## 2017-07-06 DIAGNOSIS — N1330 Unspecified hydronephrosis: Secondary | ICD-10-CM | POA: Diagnosis not present

## 2017-07-06 MED ORDER — FUROSEMIDE 10 MG/ML IJ SOLN
INTRAMUSCULAR | Status: AC
  Filled 2017-07-06: qty 8

## 2017-07-06 MED ORDER — TECHNETIUM TC 99M MERTIATIDE
5.4500 | Freq: Once | INTRAVENOUS | Status: AC | PRN
  Administered 2017-07-06: 5.45 via INTRAVENOUS

## 2017-07-06 MED ORDER — FUROSEMIDE 10 MG/ML IJ SOLN: 50.0000 mg | Freq: Once | INTRAMUSCULAR | Status: DC

## 2017-12-23 DIAGNOSIS — R3915 Urgency of urination: Secondary | ICD-10-CM | POA: Diagnosis not present

## 2017-12-23 DIAGNOSIS — N401 Enlarged prostate with lower urinary tract symptoms: Secondary | ICD-10-CM | POA: Diagnosis not present

## 2017-12-23 DIAGNOSIS — N5201 Erectile dysfunction due to arterial insufficiency: Secondary | ICD-10-CM | POA: Diagnosis not present

## 2017-12-23 DIAGNOSIS — Q6211 Congenital occlusion of ureteropelvic junction: Secondary | ICD-10-CM | POA: Diagnosis not present

## 2018-01-13 DIAGNOSIS — H25813 Combined forms of age-related cataract, bilateral: Secondary | ICD-10-CM | POA: Diagnosis not present

## 2018-01-27 DIAGNOSIS — H25812 Combined forms of age-related cataract, left eye: Secondary | ICD-10-CM | POA: Diagnosis not present

## 2018-01-27 NOTE — Patient Instructions (Signed)
Your procedure is scheduled on:  02/04/2018               Report to [facility] at  7:50   AM.  Call this number if you have problems the morning of surgery: [phone]   Remember:   Do not eat or drink :After Midnight.    Take these medicines the morning of surgery with A SIP OF WATER:   none         Do not wear jewelry, make-up or nail polish.  Do not wear lotions, powders, or perfumes. You may wear deodorant.  Do not bring valuables to the hospital.  Contacts, dentures or bridgework may not be worn into surgery.  Patients discharged the day of surgery will not be allowed to drive home.  Name and phone number of your driver:    [email or web address] @ Cataract Surgery  A cataract is a clouding of the lens of the eye. When a lens becomes cloudy, vision is reduced based on the degree and nature of the clouding. Surgery may be needed to improve vision. Surgery removes the cloudy lens and usually replaces it with a substitute lens (intraocular lens, IOL). LET YOUR EYE DOCTOR KNOW ABOUT:  Allergies to food or medicine.   Medicines taken including herbs, eyedrops, over-the-counter medicines, and creams.   Use of steroids (by mouth or creams).   Previous problems with anesthetics or numbing medicine.   History of bleeding problems or blood clots.   Previous surgery.   Other health problems, including diabetes and kidney problems.   Possibility of pregnancy, if this applies.  RISKS AND COMPLICATIONS  Infection.   Inflammation of the eyeball (endophthalmitis) that can spread to both eyes (sympathetic ophthalmia).   Poor wound healing.   If an IOL is inserted, it can later fall out of proper position. This is very uncommon.   Clouding of the part of your eye that holds an IOL in place. This is called an "after-cataract." These are uncommon, but easily treated.  BEFORE THE PROCEDURE  Do not eat or drink anything except small amounts of water for 8 to 12 before your surgery, or  as directed by your caregiver.   Unless you are told otherwise, continue any eyedrops you have been prescribed.   Talk to your primary caregiver about all other medicines that you take (both prescription and non-prescription). In some cases, you may need to stop or change medicines near the time of your surgery. This is most important if you are taking blood-thinning medicine.Do not stop medicines unless you are told to do so.   Arrange for someone to drive you to and from the procedure.   Do not put contact lenses in either eye on the day of your surgery.  PROCEDURE There is more than one method for safely removing a cataract. Your doctor can explain the differences and help determine which is best for you. Phacoemulsification surgery is the most common form of cataract surgery.  An injection is given behind the eye or eyedrops are given to make this a painless procedure.   A small cut (incision) is made on the edge of the clear, dome-shaped surface that covers the front of the eye (cornea).   A tiny probe is painlessly inserted into the eye. This device gives off ultrasound waves that soften and break up the cloudy center of the lens. This makes it easier for the cloudy lens to be removed by suction.   An IOL may be implanted.  The normal lens of the eye is covered by a clear capsule. Part of that capsule is intentionally left in the eye to support the IOL.   Your surgeon may or may not use stitches to close the incision.  There are other forms of cataract surgery that require a larger incision and stiches to close the eye. This approach is taken in cases where the doctor feels that the cataract cannot be easily removed using phacoemulsification. AFTER THE PROCEDURE  When an IOL is implanted, it does not need care. It becomes a permanent part of your eye and cannot be seen or felt.   Your doctor will schedule follow-up exams to check on your progress.   Review your other medicines  with your doctor to see which can be resumed after surgery.   Use eyedrops or take medicine as prescribed by your doctor.  Document Released: 06/25/2011 Document Reviewed: 06/22/2011 [facility] Patient Information 2012 [place].  .Cataract Surgery Care After Refer to this sheet in the next few weeks. These instructions provide you with information on caring for yourself after your procedure. Your caregiver may also give you more specific instructions. Your treatment has been planned according to current medical practices, but problems sometimes occur. Call your caregiver if you have any problems or questions after your procedure.  HOME CARE INSTRUCTIONS   Avoid strenuous activities as directed by your caregiver.   Ask your caregiver when you can resume driving.   Use eyedrops or other medicines to help healing and control pressure inside your eye as directed by your caregiver.   Only take over-the-counter or prescription medicines for pain, discomfort, or fever as directed by your caregiver.   Do not to touch or rub your eyes.   You may be instructed to use a protective shield during the first few days and nights after surgery. If not, wear sunglasses to protect your eyes. This is to protect the eye from pressure or from being accidentally bumped.   Keep the area around your eye clean and dry. Avoid swimming or allowing water to hit you directly in the face while showering. Keep soap and shampoo out of your eyes.   Do not bend or lift heavy objects. Bending increases pressure in the eye. You can walk, climb stairs, and do light household chores.   Do not put a contact lens into the eye that had surgery until your caregiver says it is okay to do so.   Ask your doctor when you can return to work. This will depend on the kind of work that you do. If you work in a dusty environment, you may be advised to wear protective eyewear for a period of time.   Ask your caregiver when it will  be safe to engage in sexual activity.   Continue with your regular eye exams as directed by your caregiver.  What to expect:  It is normal to feel itching and mild discomfort for a few days after cataract surgery. Some fluid discharge is also common, and your eye may be sensitive to light and touch.   After 1 to 2 days, even moderate discomfort should disappear. In most cases, healing will take about 6 weeks.   If you received an intraocular lens (IOL), you may notice that colors are very bright or have a blue tinge. Also, if you have been in bright sunlight, everything may appear reddish for a few hours. If you see these color tinges, it is because your lens is  clear and no longer cloudy. Within a few months after receiving an IOL, these extra colors should go away. When you have healed, you will probably need new glasses.  SEEK MEDICAL CARE IF:   You have increased bruising around your eye.   You have discomfort not helped by medicine.  SEEK IMMEDIATE MEDICAL CARE IF:   You have a fever.   You have a worsening or sudden vision loss.   You have redness, swelling, or increasing pain in the eye.   You have a thick discharge from the eye that had surgery.  MAKE SURE YOU:  Understand these instructions.   Will watch your condition.   Will get help right away if you are not doing well or get worse.  Document Released: 01/23/2005 Document Revised: 06/25/2011 Document Reviewed: 02/27/2011 [facility] Patient Information 2012 [place].    Monitored Anesthesia Care  Monitored anesthesia care is an anesthesia service for a medical procedure. Anesthesia is the loss of the ability to feel pain. It is produced by medications called anesthetics. It may affect a small area of your body (local anesthesia), a large area of your body (regional anesthesia), or your entire body (general anesthesia). The need for monitored anesthesia care depends your procedure, your condition, and the potential  need for regional or general anesthesia. It is often provided during procedures where:   General anesthesia may be needed if there are complications. This is because you need special care when you are under general anesthesia.   You will be under local or regional anesthesia. This is so that you are able to have higher levels of anesthesia if needed.   You will receive calming medications (sedatives). This is especially the case if sedatives are given to put you in a semi-conscious state of relaxation (deep sedation). This is because the amount of sedative needed to produce this state can be hard to predict. Too much of a sedative can produce general anesthesia. Monitored anesthesia care is performed by one or more caregivers who have special training in all types of anesthesia. You will need to meet with these caregivers before your procedure. During this meeting, they will ask you about your medical history. They will also give you instructions to follow. (For example, you will need to stop eating and drinking before your procedure. You may also need to stop or change medications you are taking.) During your procedure, your caregivers will stay with you. They will:   Watch your condition. This includes watching you blood pressure, breathing, and level of pain.   Diagnose and treat problems that occur.   Give medications if they are needed. These may include calming medications (sedatives) and anesthetics.   Make sure you are comfortable.  Having monitored anesthesia care does not necessarily mean that you will be under anesthesia. It does mean that your caregivers will be able to manage anesthesia if you need it or if it occurs. It also means that you will be able to have a different type of anesthesia than you are having if you need it. When your procedure is complete, your caregivers will continue to watch your condition. They will make sure any medications wear off before you are allowed  to go home.  Document Released: 04/01/2005 Document Revised: 10/31/2012 Document Reviewed: 08/17/2012 [facility] Patient Information 2014 [place], [place].

## 2018-01-28 ENCOUNTER — Other Ambulatory Visit: Payer: Self-pay

## 2018-01-28 ENCOUNTER — Encounter (HOSPITAL_COMMUNITY): Payer: Self-pay

## 2018-01-28 ENCOUNTER — Encounter (HOSPITAL_COMMUNITY)
Admission: RE | Admit: 2018-01-28 | Discharge: 2018-01-28 | Disposition: A | Discharge status: Home / Self Care | Payer: PPO | Source: Ambulatory Visit | Attending: Ophthalmology | Admitting: Ophthalmology

## 2018-01-28 DIAGNOSIS — Z01812 Encounter for preprocedural laboratory examination: Secondary | ICD-10-CM | POA: Insufficient documentation

## 2018-01-28 DIAGNOSIS — Z01810 Encounter for preprocedural cardiovascular examination: Secondary | ICD-10-CM | POA: Diagnosis not present

## 2018-01-28 LAB — CBC
HCT: 46.1 % (ref 39.0–52.0)
HEMOGLOBIN: 15.7 g/dL (ref 13.0–17.0)
MCH: 30.1 pg (ref 26.0–34.0)
MCHC: 34.1 g/dL (ref 30.0–36.0)
MCV: 88.5 fL (ref 78.0–100.0)
Platelets: 221 10*3/uL (ref 150–400)
RBC: 5.21 MIL/uL (ref 4.22–5.81)
RDW: 12.9 % (ref 11.5–15.5)
WBC: 7.9 10*3/uL (ref 4.0–10.5)

## 2018-01-28 LAB — BASIC METABOLIC PANEL
ANION GAP: 7 (ref 5–15)
BUN: 24 mg/dL — ABNORMAL HIGH (ref 8–23)
CO2: 26 mmol/L (ref 22–32)
Calcium: 8.9 mg/dL (ref 8.9–10.3)
Chloride: 104 mmol/L (ref 98–111)
Creatinine, Ser: 1.46 mg/dL — ABNORMAL HIGH (ref 0.61–1.24)
GFR calc non Af Amer: 49 mL/min — ABNORMAL LOW (ref >60)
GFR, EST AFRICAN AMERICAN: 56 mL/min — AB (ref >60)
Glucose, Bld: 103 mg/dL — ABNORMAL HIGH (ref 70–99)
POTASSIUM: 4.1 mmol/L (ref 3.5–5.1)
SODIUM: 137 mmol/L (ref 135–145)

## 2018-02-04 ENCOUNTER — Ambulatory Visit (HOSPITAL_COMMUNITY): Payer: PPO | Admitting: Anesthesiology

## 2018-02-04 ENCOUNTER — Encounter (HOSPITAL_COMMUNITY)
Admission: RE | Disposition: A | Discharge status: Home / Self Care | Payer: Self-pay | Source: Ambulatory Visit | Attending: Ophthalmology

## 2018-02-04 ENCOUNTER — Ambulatory Visit (HOSPITAL_COMMUNITY)
Admission: RE | Admit: 2018-02-04 | Discharge: 2018-02-04 | Disposition: A | Discharge status: Home / Self Care | Payer: PPO | Source: Ambulatory Visit | Attending: Ophthalmology | Admitting: Ophthalmology

## 2018-02-04 DIAGNOSIS — H2512 Age-related nuclear cataract, left eye: Secondary | ICD-10-CM | POA: Diagnosis not present

## 2018-02-04 DIAGNOSIS — Z7982 Long term (current) use of aspirin: Secondary | ICD-10-CM | POA: Insufficient documentation

## 2018-02-04 DIAGNOSIS — N189 Chronic kidney disease, unspecified: Secondary | ICD-10-CM | POA: Insufficient documentation

## 2018-02-04 DIAGNOSIS — E7800 Pure hypercholesterolemia, unspecified: Secondary | ICD-10-CM | POA: Diagnosis not present

## 2018-02-04 DIAGNOSIS — H25813 Combined forms of age-related cataract, bilateral: Secondary | ICD-10-CM | POA: Insufficient documentation

## 2018-02-04 DIAGNOSIS — I129 Hypertensive chronic kidney disease with stage 1 through stage 4 chronic kidney disease, or unspecified chronic kidney disease: Secondary | ICD-10-CM | POA: Insufficient documentation

## 2018-02-04 DIAGNOSIS — H25812 Combined forms of age-related cataract, left eye: Secondary | ICD-10-CM | POA: Diagnosis not present

## 2018-02-04 HISTORY — PX: CATARACT EXTRACTION W/PHACO: SHX586

## 2018-02-04 SURGERY — PHACOEMULSIFICATION, CATARACT, WITH IOL INSERTION
Anesthesia: Monitor Anesthesia Care | Site: Eye | Laterality: Left

## 2018-02-04 MED ORDER — MIDAZOLAM HCL 2 MG/2ML IJ SOLN
INTRAMUSCULAR | Status: AC
  Filled 2018-02-04: qty 2

## 2018-02-04 MED ORDER — BSS IO SOLN
INTRAOCULAR | Status: DC | PRN
  Administered 2018-02-04: 15 mL

## 2018-02-04 MED ORDER — POVIDONE-IODINE 5 % OP SOLN
OPHTHALMIC | Status: DC | PRN
  Administered 2018-02-04: 1 via OPHTHALMIC

## 2018-02-04 MED ORDER — PROVISC 10 MG/ML IO SOLN
INTRAOCULAR | Status: DC | PRN
  Administered 2018-02-04: 0.85 mL via INTRAOCULAR

## 2018-02-04 MED ORDER — MIDAZOLAM HCL 2 MG/2ML IJ SOLN
INTRAMUSCULAR | Status: DC | PRN
  Administered 2018-02-04 (×2): 1 mg via INTRAVENOUS

## 2018-02-04 MED ORDER — CYCLOPENTOLATE-PHENYLEPHRINE 0.2-1 % OP SOLN
1.0000 [drp] | OPHTHALMIC | Status: AC
  Administered 2018-02-04 (×3): 1 [drp] via OPHTHALMIC

## 2018-02-04 MED ORDER — SODIUM HYALURONATE 23 MG/ML IO SOLN
INTRAOCULAR | Status: DC | PRN
  Administered 2018-02-04: 0.6 mL via INTRAOCULAR

## 2018-02-04 MED ORDER — NEOMYCIN-POLYMYXIN-DEXAMETH 3.5-10000-0.1 OP SUSP
OPHTHALMIC | Status: DC | PRN
  Administered 2018-02-04: 2 [drp] via OPHTHALMIC

## 2018-02-04 MED ORDER — EPINEPHRINE PF 1 MG/ML IJ SOLN
INTRAOCULAR | Status: DC | PRN
  Administered 2018-02-04: .9 mL via OPHTHALMIC

## 2018-02-04 MED ORDER — EPINEPHRINE PF 1 MG/ML IJ SOLN
INTRAOCULAR | Status: DC | PRN
  Administered 2018-02-04: 500 mL

## 2018-02-04 MED ORDER — TETRACAINE HCL 0.5 % OP SOLN
1.0000 [drp] | OPHTHALMIC | Status: AC
  Administered 2018-02-04 (×3): 1 [drp] via OPHTHALMIC

## 2018-02-04 MED ORDER — PHENYLEPHRINE HCL 2.5 % OP SOLN
1.0000 [drp] | OPHTHALMIC | Status: AC
  Administered 2018-02-04 (×3): 1 [drp] via OPHTHALMIC

## 2018-02-04 MED ORDER — LACTATED RINGERS IV SOLN
INTRAVENOUS | Status: DC
  Administered 2018-02-04: 08:00:00 via INTRAVENOUS

## 2018-02-04 MED ORDER — LIDOCAINE HCL 3.5 % OP GEL
1.0000 "application " | Freq: Once | OPHTHALMIC | Status: AC
  Administered 2018-02-04: 1 via OPHTHALMIC

## 2018-02-04 SURGICAL SUPPLY — 13 items

## 2018-02-04 NOTE — Op Note (Signed)
Date of procedure: 02/04/18  Pre-operative diagnosis: Visually significant cataract, Left Eye (H25.812)  Post-operative diagnosis: Visually significant cataract, Left Eye  Procedure: Removal of cataract via phacoemulsification and insertion of intra-ocular lens Wynetta Emery and Brutus  +19.5D into the capsular bag of the Left Eye  Attending surgeon: Gerda Diss. Francie Keeling, MD, MA  Anesthesia: MAC, Topical Akten  Complications: None  Estimated Blood Loss: <46m (minimal)  Specimens: None  Implants: As above  Indications:  Visually significant cataract, Left Eye  Procedure:  The patient was seen and identified in the pre-operative area. The operative eye was identified and dilated.  The operative eye was marked.  Topical anesthesia was administered to the operative eye.     The patient was then to the operative suite and placed in the supine position.  A timeout was performed confirming the patient, procedure to be performed, and all other relevant information.   The patient's face was prepped and draped in the usual fashion for intra-ocular surgery.  A lid speculum was placed into the operative eye and the surgical microscope moved into place and focused.  An inferotemporal paracentesis was created using a 20 gauge paracentesis blade.  Shugarcaine was injected into the anterior chamber.  Viscoelastic was injected into the anterior chamber.  A temporal clear-corneal main wound incision was created using a 2.442mmicrokeratome.  A continuous curvilinear capsulorrhexis was initiated using an irrigating cystitome and completed using capsulorrhexis forceps.  Hydrodissection and hydrodeliniation were performed.  Viscoelastic was injected into the anterior chamber.  A phacoemulsification handpiece and a chopper as a second instrument were used to remove the nucleus and epinucleus. The irrigation/aspiration handpiece was used to remove any remaining cortical material.   The capsular bag was  reinflated with viscoelastic, checked, and found to be intact.  The intraocular lens was inserted into the capsular bag and dialed into place using a Kuglen hook.  The irrigation/aspiration handpiece was used to remove any remaining viscoelastic.  The clear corneal wound and paracentesis wounds were then hydrated and checked with Weck-Cels to be watertight.  The lid-speculum and drape was removed, and the patient's face was cleaned with a wet and dry 4x4.  Maxitrol was instilled in the eye before a clear shield was taped over the eye. The patient was taken to the post-operative care unit in good condition, having tolerated the procedure well.  Post-Op Instructions: [facility] and will receive all other orders and instructions.

## 2018-02-04 NOTE — Discharge Instructions (Signed)
Please discharge patient when stable, will follow up today with Dr. Tamya Denardo at the [facility] office immediately following discharge.  Leave shield in place until visit.  All paperwork with discharge instructions will be given at the office. ° °

## 2018-02-04 NOTE — H&P (Signed)
The H and P was reviewed and updated. The patient was examined.  No changes were found after exam.  The surgical eye was marked.  

## 2018-02-04 NOTE — Anesthesia Procedure Notes (Signed)
Procedure Name: MAC Date/Time: 02/04/2018 8:31 AM Performed by: Vista Deck, CRNA Pre-anesthesia Checklist: Patient identified, Emergency Drugs available, Suction available, Timeout performed and Patient being monitored Patient Re-evaluated:Patient Re-evaluated prior to induction Oxygen Delivery Method: Nasal Cannula

## 2018-02-04 NOTE — Anesthesia Postprocedure Evaluation (Signed)
Anesthesia Post Note  Patient: Noah Cook  Procedure(s) Performed: CATARACT EXTRACTION PHACO AND INTRAOCULAR LENS PLACEMENT ([place]) (Left Eye)  Patient location during evaluation: Short Stay Anesthesia Type: MAC Level of consciousness: awake and alert and patient cooperative Pain management: pain level controlled Vital Signs Assessment: post-procedure vital signs reviewed and stable Respiratory status: spontaneous breathing Cardiovascular status: stable Postop Assessment: no apparent nausea or vomiting Anesthetic complications: no     Last Vitals:  Vitals:   02/04/18 0758 02/04/18 0854  BP: (!) 148/90 (!) 143/81  Pulse:  (!) 59  Resp: 18 16  Temp:  36.7 C  SpO2: 97% 97%    Last Pain:  Vitals:   02/04/18 0854  TempSrc: Oral  PainSc:                  Drucie Opitz

## 2018-02-04 NOTE — Anesthesia Preprocedure Evaluation (Signed)
Anesthesia Evaluation  Patient identified by MRN, date of birth, ID band Patient awake    Reviewed: Allergy & Precautions, NPO status , Patient's Chart, lab work & pertinent test results  Airway Mallampati: II  TM Distance: >3 FB Neck ROM: Full    Dental no notable dental hx. (+) Teeth Intact   Pulmonary neg pulmonary ROS,    Pulmonary exam normal breath sounds clear to auscultation       Cardiovascular Exercise Tolerance: Good negative cardio ROS Normal cardiovascular examI Rhythm:Regular Rate:Normal     Neuro/Psych negative neurological ROS  negative psych ROS   GI/Hepatic negative GI ROS, Neg liver ROS,   Endo/Other  negative endocrine ROS  Renal/GU Renal InsufficiencyRenal diseasenegative Renal ROS  negative genitourinary   Musculoskeletal negative musculoskeletal ROS (+)   Abdominal   Peds negative pediatric ROS (+)  Hematology negative hematology ROS (+) anemia ,   Anesthesia Other Findings   Reproductive/Obstetrics negative OB ROS                             Anesthesia Physical Anesthesia Plan  ASA: II  Anesthesia Plan: MAC   Post-op Pain Management:    Induction: Intravenous  PONV Risk Score and Plan:   Airway Management Planned: Nasal Cannula  Additional Equipment:   Intra-op Plan:   Post-operative Plan:   Informed Consent: I have reviewed the patients History and Physical, chart, labs and discussed the procedure including the risks, benefits and alternatives for the proposed anesthesia with the patient or authorized representative who has indicated his/her understanding and acceptance.   Dental advisory given  Plan Discussed with: CRNA  Anesthesia Plan Comments:         Anesthesia Quick Evaluation

## 2018-02-04 NOTE — Transfer of Care (Signed)
Immediate Anesthesia Transfer of Care Note  Patient: Noah Cook  Procedure(s) Performed: CATARACT EXTRACTION PHACO AND INTRAOCULAR LENS PLACEMENT ([facility]) (Left Eye)  Patient Location: Short Stay  Anesthesia Type:MAC  Level of Consciousness: awake, alert  and patient cooperative  Airway & Oxygen Therapy: Patient Spontanous Breathing  Post-op Assessment: Report given to RN and Post -op Vital signs reviewed and stable  Post vital signs: Reviewed and stable  Last Vitals:  Vitals Value Taken Time  BP    Temp    Pulse    Resp    SpO2      Last Pain:  Vitals:   02/04/18 0724  PainSc: 0-No pain         Complications: No apparent anesthesia complications

## 2018-02-07 ENCOUNTER — Encounter (HOSPITAL_COMMUNITY): Payer: Self-pay | Admitting: Ophthalmology

## 2018-02-10 DIAGNOSIS — H25811 Combined forms of age-related cataract, right eye: Secondary | ICD-10-CM | POA: Diagnosis not present

## 2018-02-15 ENCOUNTER — Encounter (HOSPITAL_COMMUNITY): Payer: Self-pay

## 2018-02-15 ENCOUNTER — Encounter (HOSPITAL_COMMUNITY)
Admission: RE | Admit: 2018-02-15 | Discharge: 2018-02-15 | Disposition: A | Discharge status: Home / Self Care | Payer: PPO | Source: Ambulatory Visit | Attending: Ophthalmology | Admitting: Ophthalmology

## 2018-02-15 MED ORDER — FENTANYL CITRATE (PF) 100 MCG/2ML IJ SOLN: 25.0000 ug | INTRAMUSCULAR | Status: DC | PRN

## 2018-02-15 MED ORDER — HYDROCODONE-ACETAMINOPHEN 7.5-325 MG PO TABS: 1.0000 | ORAL_TABLET | Freq: Once | ORAL | Status: DC | PRN

## 2018-02-18 ENCOUNTER — Encounter (HOSPITAL_COMMUNITY)
Admission: RE | Disposition: A | Discharge status: Home / Self Care | Payer: Self-pay | Source: Ambulatory Visit | Attending: Ophthalmology

## 2018-02-18 ENCOUNTER — Other Ambulatory Visit: Payer: Self-pay

## 2018-02-18 ENCOUNTER — Ambulatory Visit (HOSPITAL_COMMUNITY): Payer: PPO | Admitting: Anesthesiology

## 2018-02-18 ENCOUNTER — Encounter (HOSPITAL_COMMUNITY): Payer: Self-pay | Admitting: *Deleted

## 2018-02-18 ENCOUNTER — Ambulatory Visit (HOSPITAL_COMMUNITY)
Admission: RE | Admit: 2018-02-18 | Discharge: 2018-02-18 | Disposition: A | Discharge status: Home / Self Care | Payer: PPO | Source: Ambulatory Visit | Attending: Ophthalmology | Admitting: Ophthalmology

## 2018-02-18 DIAGNOSIS — Z79899 Other long term (current) drug therapy: Secondary | ICD-10-CM | POA: Diagnosis not present

## 2018-02-18 DIAGNOSIS — I10 Essential (primary) hypertension: Secondary | ICD-10-CM | POA: Diagnosis not present

## 2018-02-18 DIAGNOSIS — H269 Unspecified cataract: Secondary | ICD-10-CM | POA: Insufficient documentation

## 2018-02-18 DIAGNOSIS — Z7982 Long term (current) use of aspirin: Secondary | ICD-10-CM | POA: Diagnosis not present

## 2018-02-18 DIAGNOSIS — D649 Anemia, unspecified: Secondary | ICD-10-CM | POA: Diagnosis not present

## 2018-02-18 DIAGNOSIS — H2511 Age-related nuclear cataract, right eye: Secondary | ICD-10-CM | POA: Diagnosis not present

## 2018-02-18 DIAGNOSIS — N189 Chronic kidney disease, unspecified: Secondary | ICD-10-CM | POA: Diagnosis not present

## 2018-02-18 DIAGNOSIS — H25811 Combined forms of age-related cataract, right eye: Secondary | ICD-10-CM | POA: Diagnosis not present

## 2018-02-18 HISTORY — PX: CATARACT EXTRACTION W/PHACO: SHX586

## 2018-02-18 SURGERY — PHACOEMULSIFICATION, CATARACT, WITH IOL INSERTION
Anesthesia: Monitor Anesthesia Care | Site: Eye | Laterality: Right

## 2018-02-18 MED ORDER — MIDAZOLAM HCL 5 MG/5ML IJ SOLN
INTRAMUSCULAR | Status: DC | PRN
  Administered 2018-02-18: 1 mg via INTRAVENOUS

## 2018-02-18 MED ORDER — MIDAZOLAM HCL 2 MG/2ML IJ SOLN
INTRAMUSCULAR | Status: AC
  Filled 2018-02-18: qty 2

## 2018-02-18 MED ORDER — LACTATED RINGERS IV SOLN
INTRAVENOUS | Status: DC
  Administered 2018-02-18: 10:00:00 via INTRAVENOUS

## 2018-02-18 MED ORDER — TETRACAINE HCL 0.5 % OP SOLN
1.0000 [drp] | OPHTHALMIC | Status: AC
  Administered 2018-02-18 (×3): 1 [drp] via OPHTHALMIC

## 2018-02-18 MED ORDER — FENTANYL CITRATE (PF) 100 MCG/2ML IJ SOLN
INTRAMUSCULAR | Status: AC
  Filled 2018-02-18: qty 2

## 2018-02-18 MED ORDER — LIDOCAINE HCL (PF) 1 % IJ SOLN
INTRAOCULAR | Status: DC | PRN
  Administered 2018-02-18: 1 mL via OPHTHALMIC

## 2018-02-18 MED ORDER — NEOMYCIN-POLYMYXIN-DEXAMETH 3.5-10000-0.1 OP SUSP
OPHTHALMIC | Status: DC | PRN
  Administered 2018-02-18: 2 [drp] via OPHTHALMIC

## 2018-02-18 MED ORDER — PROVISC 10 MG/ML IO SOLN
INTRAOCULAR | Status: DC | PRN
  Administered 2018-02-18: 0.85 mL via INTRAOCULAR

## 2018-02-18 MED ORDER — PHENYLEPHRINE HCL 2.5 % OP SOLN
1.0000 [drp] | OPHTHALMIC | Status: AC
  Administered 2018-02-18 (×3): 1 [drp] via OPHTHALMIC

## 2018-02-18 MED ORDER — LIDOCAINE HCL 3.5 % OP GEL
1.0000 "application " | Freq: Once | OPHTHALMIC | Status: AC
  Administered 2018-02-18: 1 via OPHTHALMIC

## 2018-02-18 MED ORDER — CYCLOPENTOLATE-PHENYLEPHRINE 0.2-1 % OP SOLN
1.0000 [drp] | OPHTHALMIC | Status: AC
  Administered 2018-02-18 (×3): 1 [drp] via OPHTHALMIC

## 2018-02-18 MED ORDER — SODIUM HYALURONATE 23 MG/ML IO SOLN
INTRAOCULAR | Status: DC | PRN
  Administered 2018-02-18: 0.6 mL via INTRAOCULAR

## 2018-02-18 MED ORDER — POVIDONE-IODINE 5 % OP SOLN
OPHTHALMIC | Status: DC | PRN
  Administered 2018-02-18: 1 via OPHTHALMIC

## 2018-02-18 MED ORDER — BSS IO SOLN
INTRAOCULAR | Status: DC | PRN
  Administered 2018-02-18: 15 mL

## 2018-02-18 MED ORDER — FENTANYL CITRATE (PF) 100 MCG/2ML IJ SOLN
INTRAMUSCULAR | Status: DC | PRN
  Administered 2018-02-18: 50 ug via INTRAVENOUS

## 2018-02-18 MED ORDER — EPINEPHRINE PF 1 MG/ML IJ SOLN
INTRAOCULAR | Status: DC | PRN
  Administered 2018-02-18: 500 mL

## 2018-02-18 SURGICAL SUPPLY — 13 items

## 2018-02-18 NOTE — Anesthesia Postprocedure Evaluation (Signed)
Anesthesia Post Note  Patient: Noah Cook  Procedure(s) Performed: CATARACT EXTRACTION PHACO AND INTRAOCULAR LENS PLACEMENT ([facility]) (Right Eye)  Patient location during evaluation: PACU Anesthesia Type: MAC Level of consciousness: awake and alert and oriented Pain management: pain level controlled Vital Signs Assessment: post-procedure vital signs reviewed and stable Respiratory status: spontaneous breathing, nonlabored ventilation, respiratory function stable and patient connected to nasal cannula oxygen Cardiovascular status: stable Postop Assessment: no apparent nausea or vomiting Anesthetic complications: no     Last Vitals:  Vitals:   02/18/18 0930 02/18/18 1056  BP: (!) 166/82 135/77  Pulse: 64 (!) 53  Resp: 11 14  Temp: 36.7 C 37 C  SpO2: 97% 97%    Last Pain:  Vitals:   02/18/18 1056  TempSrc: Oral  PainSc: 0-No pain                 [name]

## 2018-02-18 NOTE — Anesthesia Preprocedure Evaluation (Signed)
Anesthesia Evaluation  Patient identified by MRN, date of birth, ID band Patient awake    Reviewed: Allergy & Precautions, H&P , NPO status , Patient's Chart, lab work & pertinent test results, reviewed documented beta blocker date and time   Airway Mallampati: II  TM Distance: >3 FB Neck ROM: full    Dental no notable dental hx. (+) Dental Advidsory Given   Pulmonary neg pulmonary ROS,    Pulmonary exam normal breath sounds clear to auscultation       Cardiovascular Exercise Tolerance: Good negative cardio ROS   Rhythm:regular Rate:Normal     Neuro/Psych negative neurological ROS  negative psych ROS   GI/Hepatic negative GI ROS, Neg liver ROS,   Endo/Other  negative endocrine ROS  Renal/GU Renal diseasenegative Renal ROS  negative genitourinary   Musculoskeletal   Abdominal   Peds  Hematology negative hematology ROS (+) anemia ,   Anesthesia Other Findings Phaco/IOL left eye two weeks ago with anesthesia issues.  Requests same today  Reproductive/Obstetrics negative OB ROS                             Anesthesia Physical Anesthesia Plan  ASA: III  Anesthesia Plan: MAC   Post-op Pain Management:    Induction:   PONV Risk Score and Plan:   Airway Management Planned:   Additional Equipment:   Intra-op Plan:   Post-operative Plan:   Informed Consent: I have reviewed the patients History and Physical, chart, labs and discussed the procedure including the risks, benefits and alternatives for the proposed anesthesia with the patient or authorized representative who has indicated his/her understanding and acceptance.   Dental Advisory Given  Plan Discussed with: CRNA and Anesthesiologist  Anesthesia Plan Comments:         Anesthesia Quick Evaluation

## 2018-02-18 NOTE — Discharge Instructions (Addendum)
Please discharge patient when stable, will follow up today with Dr. Wrzosek at the [facility] office immediately following discharge.  Leave shield in place until visit.  All paperwork with discharge instructions will be given at the office. ° ° °Monitored Anesthesia Care, Care After °These instructions provide you with information about caring for yourself after your procedure. Your health care provider may also give you more specific instructions. Your treatment has been planned according to current medical practices, but problems sometimes occur. Call your health care provider if you have any problems or questions after your procedure. °What can I expect after the procedure? °After your procedure, it is common to: °· Feel sleepy for several hours. °· Feel clumsy and have poor balance for several hours. °· Feel forgetful about what happened after the procedure. °· Have poor judgment for several hours. °· Feel nauseous or vomit. °· Have a sore throat if you had a breathing tube during the procedure. ° °Follow these instructions at home: °For at least 24 hours after the procedure: ° °· Do not: °? Participate in activities in which you could fall or become injured. °? Drive. °? Use heavy machinery. °? Drink alcohol. °? Take sleeping pills or medicines that cause drowsiness. °? Make important decisions or sign legal documents. °? Take care of children on your own. °· Rest. °Eating and drinking °· Follow the diet that is recommended by your health care provider. °· If you vomit, drink water, juice, or soup when you can drink without vomiting. °· Make sure you have little or no nausea before eating solid foods. °General instructions °· Have a responsible adult stay with you until you are awake and alert. °· Take over-the-counter and prescription medicines only as told by your health care provider. °· If you smoke, do not smoke without supervision. °· Keep all follow-up visits as told by your health care  provider. This is important. °Contact a health care provider if: °· You keep feeling nauseous or you keep vomiting. °· You feel light-headed. °· You develop a rash. °· You have a fever. °Get help right away if: °· You have trouble breathing. °This information is not intended to replace advice given to you by your health care provider. Make sure you discuss any questions you have with your health care provider. °Document Released: 10/27/2015 Document Revised: 02/26/2016 Document Reviewed: 10/27/2015 °[employer] Interactive Patient Education © 2018 [employer] ° °

## 2018-02-18 NOTE — H&P (Signed)
The H and P was reviewed and updated. The patient was examined.  No changes were found after exam.  The surgical eye was marked.  

## 2018-02-18 NOTE — Op Note (Signed)
Date of procedure: 02/18/18  Pre-operative diagnosis: Visually significant cataract, Right Eye (H25.811)  Post-operative diagnosis: Visually significant cataract, Right Eye  Procedure: Removal of cataract via phacoemulsification and insertion of intra-ocular lens [employer] PCB00  +19.0D into the capsular bag of the Right Eye  Attending surgeon: Gerda Diss. Aryon Nham, MD, MA  Anesthesia: MAC, Topical Akten  Complications: None  Estimated Blood Loss: <86m (minimal)  Specimens: None  Implants: As above  Indications:  Visually significant cataract, Right Eye  Procedure:  The patient was seen and identified in the pre-operative area. The operative eye was identified and dilated.  The operative eye was marked.  Topical anesthesia was administered to the operative eye.     The patient was then to the operative suite and placed in the supine position.  A timeout was performed confirming the patient, procedure to be performed, and all other relevant information.   The patient's face was prepped and draped in the usual fashion for intra-ocular surgery.  A lid speculum was placed into the operative eye and the surgical microscope moved into place and focused.  A superotemporal paracentesis was created using a 20 gauge paracentesis blade.  Shugarcaine was injected into the anterior chamber.  Viscoelastic was injected into the anterior chamber.  A temporal clear-corneal main wound incision was created using a 2.466mmicrokeratome.  A continuous curvilinear capsulorrhexis was initiated using an irrigating cystitome and completed using capsulorrhexis forceps.  Hydrodissection and hydrodeliniation were performed.  Viscoelastic was injected into the anterior chamber.  A phacoemulsification handpiece and a chopper as a second instrument were used to remove the nucleus and epinucleus. The irrigation/aspiration handpiece was used to remove any remaining cortical material.   The capsular bag was  reinflated with viscoelastic, checked, and found to be intact.  The intraocular lens was inserted into the capsular bag and dialed into place using a Kuglen hook.  The irrigation/aspiration handpiece was used to remove any remaining viscoelastic.  The clear corneal wound and paracentesis wounds were then hydrated and checked with Weck-Cels to be watertight.  The lid-speculum and drape was removed, and the patient's face was cleaned with a wet and dry 4x4.  Maxitrol was instilled in the eye before a clear shield was taped over the eye. The patient was taken to the post-operative care unit in good condition, having tolerated the procedure well.  Post-Op Instructions: [facility] and will receive all other orders and instructions.

## 2018-02-18 NOTE — Transfer of Care (Signed)
Immediate Anesthesia Transfer of Care Note  Patient: Noah Cook  Procedure(s) Performed: CATARACT EXTRACTION PHACO AND INTRAOCULAR LENS PLACEMENT (IOC) (Right Eye)  Patient Location: PACU  Anesthesia Type:MAC  Level of Consciousness: awake and patient cooperative  Airway & Oxygen Therapy: Patient Spontanous Breathing  Post-op Assessment: Report given to RN and Post -op Vital signs reviewed and stable  Post vital signs: Reviewed  Last Vitals:  Vitals Value Taken Time  BP 135/77 02/18/2018 10:56 AM  Temp 37 C 02/18/2018 10:56 AM  Pulse 53 02/18/2018 10:56 AM  Resp 14 02/18/2018 10:56 AM  SpO2 97 % 02/18/2018 10:56 AM    Last Pain:  Vitals:   02/18/18 1056  TempSrc: Oral  PainSc: 0-No pain      Patients Stated Pain Goal: 7 (05/22/14 9458)  Complications: No apparent anesthesia complications

## 2018-02-21 ENCOUNTER — Encounter (HOSPITAL_COMMUNITY): Payer: Self-pay | Admitting: Ophthalmology

## 2018-04-25 DIAGNOSIS — Z23 Encounter for immunization: Secondary | ICD-10-CM | POA: Diagnosis not present

## 2018-04-25 DIAGNOSIS — Z0000 Encounter for general adult medical examination without abnormal findings: Secondary | ICD-10-CM | POA: Diagnosis not present

## 2018-04-25 DIAGNOSIS — R7309 Other abnormal glucose: Secondary | ICD-10-CM | POA: Diagnosis not present

## 2018-04-25 DIAGNOSIS — Z6834 Body mass index (BMI) 34.0-34.9, adult: Secondary | ICD-10-CM | POA: Diagnosis not present

## 2018-04-25 DIAGNOSIS — E782 Mixed hyperlipidemia: Secondary | ICD-10-CM | POA: Diagnosis not present

## 2018-04-25 DIAGNOSIS — Z1389 Encounter for screening for other disorder: Secondary | ICD-10-CM | POA: Diagnosis not present

## 2018-04-25 DIAGNOSIS — E6609 Other obesity due to excess calories: Secondary | ICD-10-CM | POA: Diagnosis not present

## 2018-07-20 IMAGING — NM NM RENAL IMAGING FLOW W/ PHARM
3 series · 13 of 13 positions shown · non-contrast
Comparison: Noncontrast CT 12/22/2016

CLINICAL DATA: Right-sided pelvicaliectasis on CT. Evaluate for UPJ
obstruction. History of prostate cancer with TURP in 8352.

EXAM:
NUCLEAR MEDICINE RENAL SCAN WITH DIURETIC ADMINISTRATION
TECHNIQUE: Radionuclide angiographic and sequential renal images were obtained
after intravenous injection of radiopharmaceutical. Imaging was
continued during slow intravenous injection of Lasix approximately
15 minutes after the start of the examination.
RADIOPHARMACEUTICALS:  5.45 mCi Vechnetium-WWm MAG3 IV

[Series 1: renal scan · 4.14mm/px · 6 of 40 frames shown (1 of 2)]
[frame 4/40  full-range]
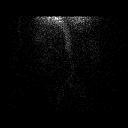
[frame 10/40  full-range]
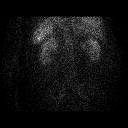
[frame 17/40  full-range]
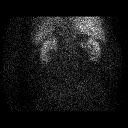
[frame 24/40  full-range]
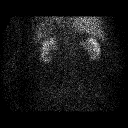
[frame 30/40  full-range]
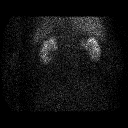
[frame 37/40  full-range]
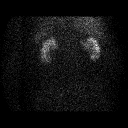

[Series 1: renal scan · 4.14mm/px · 6 of 67 frames shown (2 of 2)]
[frame 6/67]
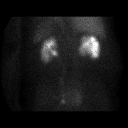
[frame 17/67]
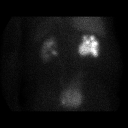
[frame 28/67]
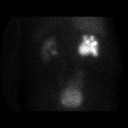
[frame 39/67]
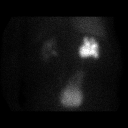
[frame 50/67]
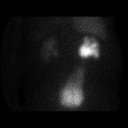
[frame 62/67]
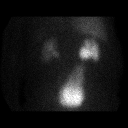

[Series 3: post void · 2.07mm/px · 1 of 1 slices shown]
[im 1/1  full-range]
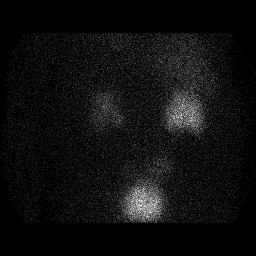

[13 of 13 positions shown; findings below may reference images not displayed]

FINDINGS: Flow:  Prompt symmetric arterial flow to the kidneys.

Left renogram: Normal renal cortical uptake and excretion of
radiopharmaceutical. No significant collecting system dilatation.
Normal renogram curve.

Right renogram: Normal renal cortical uptake of radiopharmaceutical.
There is progressive accumulation of activity within the dilated
right renal pelvis and calices prior to Lasix administration.
Following Lasix, there is partial washout of activity from the renal
pelvis (from 97% of injected activity pre Lasix to 56% post Lasix).
Renogram curve is downsloping after Lasix administration.

Differential:

Left kidney = 47 %

Right kidney = 53 %

T1/2 post Lasix :

Left kidney = NA

Right kidney = 11.4 min

The urinary bladder is moderately distended. Patient was not able to
completely empty the bladder. There is some retained collecting
system activity in the right renal pelvis following voiding.
IMPRESSION: 1. Findings are consistent with partial right UPJ obstruction. There
is response to Lasix with partial washout of collecting system
activity.
2. Normal renogram curve on the left.
3. Nearly symmetric differential renal function (47% left and 53%
right).

## 2018-09-15 DIAGNOSIS — H26493 Other secondary cataract, bilateral: Secondary | ICD-10-CM | POA: Diagnosis not present

## 2018-09-15 DIAGNOSIS — H1851 Endothelial corneal dystrophy: Secondary | ICD-10-CM | POA: Diagnosis not present

## 2018-09-15 DIAGNOSIS — Q132 Other congenital malformations of iris: Secondary | ICD-10-CM | POA: Diagnosis not present

## 2018-09-15 DIAGNOSIS — Z961 Presence of intraocular lens: Secondary | ICD-10-CM | POA: Diagnosis not present

## 2018-10-24 DIAGNOSIS — Z0001 Encounter for general adult medical examination with abnormal findings: Secondary | ICD-10-CM | POA: Diagnosis not present

## 2018-10-24 DIAGNOSIS — Z23 Encounter for immunization: Secondary | ICD-10-CM | POA: Diagnosis not present

## 2018-10-24 DIAGNOSIS — Z1389 Encounter for screening for other disorder: Secondary | ICD-10-CM | POA: Diagnosis not present

## 2018-10-24 DIAGNOSIS — L97513 Non-pressure chronic ulcer of other part of right foot with necrosis of muscle: Secondary | ICD-10-CM | POA: Diagnosis not present

## 2018-10-24 DIAGNOSIS — Z681 Body mass index (BMI) 19 or less, adult: Secondary | ICD-10-CM | POA: Diagnosis not present

## 2018-10-24 DIAGNOSIS — Z20828 Contact with and (suspected) exposure to other viral communicable diseases: Secondary | ICD-10-CM | POA: Diagnosis not present

## 2018-10-24 DIAGNOSIS — K5792 Diverticulitis of intestine, part unspecified, without perforation or abscess without bleeding: Secondary | ICD-10-CM | POA: Diagnosis not present

## 2018-10-24 DIAGNOSIS — R7309 Other abnormal glucose: Secondary | ICD-10-CM | POA: Diagnosis not present

## 2018-10-24 DIAGNOSIS — I739 Peripheral vascular disease, unspecified: Secondary | ICD-10-CM | POA: Diagnosis not present

## 2018-10-24 DIAGNOSIS — Z01812 Encounter for preprocedural laboratory examination: Secondary | ICD-10-CM | POA: Diagnosis not present

## 2018-10-24 DIAGNOSIS — E7849 Other hyperlipidemia: Secondary | ICD-10-CM | POA: Diagnosis not present

## 2019-06-02 DIAGNOSIS — Q6211 Congenital occlusion of ureteropelvic junction: Secondary | ICD-10-CM | POA: Diagnosis not present

## 2019-06-02 DIAGNOSIS — N401 Enlarged prostate with lower urinary tract symptoms: Secondary | ICD-10-CM | POA: Diagnosis not present

## 2019-06-02 DIAGNOSIS — R3912 Poor urinary stream: Secondary | ICD-10-CM | POA: Diagnosis not present

## 2019-06-02 DIAGNOSIS — N5201 Erectile dysfunction due to arterial insufficiency: Secondary | ICD-10-CM | POA: Diagnosis not present

## 2019-06-07 DIAGNOSIS — Z6835 Body mass index (BMI) 35.0-35.9, adult: Secondary | ICD-10-CM | POA: Diagnosis not present

## 2019-06-07 DIAGNOSIS — E6609 Other obesity due to excess calories: Secondary | ICD-10-CM | POA: Diagnosis not present

## 2019-06-07 DIAGNOSIS — I10 Essential (primary) hypertension: Secondary | ICD-10-CM | POA: Diagnosis not present

## 2019-06-08 ENCOUNTER — Encounter: Payer: Self-pay | Admitting: Gastroenterology

## 2019-06-21 DIAGNOSIS — I10 Essential (primary) hypertension: Secondary | ICD-10-CM | POA: Diagnosis not present

## 2019-06-21 DIAGNOSIS — E7849 Other hyperlipidemia: Secondary | ICD-10-CM | POA: Diagnosis not present

## 2019-06-21 DIAGNOSIS — E6609 Other obesity due to excess calories: Secondary | ICD-10-CM | POA: Diagnosis not present

## 2019-06-21 DIAGNOSIS — Z6835 Body mass index (BMI) 35.0-35.9, adult: Secondary | ICD-10-CM | POA: Diagnosis not present

## 2019-07-18 ENCOUNTER — Ambulatory Visit (INDEPENDENT_AMBULATORY_CARE_PROVIDER_SITE_OTHER): Payer: PPO | Admitting: *Deleted

## 2019-07-18 ENCOUNTER — Other Ambulatory Visit: Payer: Self-pay

## 2019-07-18 DIAGNOSIS — Z1211 Encounter for screening for malignant neoplasm of colon: Secondary | ICD-10-CM

## 2019-07-18 DIAGNOSIS — R3912 Poor urinary stream: Secondary | ICD-10-CM | POA: Diagnosis not present

## 2019-07-18 DIAGNOSIS — N401 Enlarged prostate with lower urinary tract symptoms: Secondary | ICD-10-CM | POA: Diagnosis not present

## 2019-07-18 MED ORDER — PEG 3350-KCL-NA BICARB-NACL 420 G PO SOLR: 4000.0000 mL | Freq: Once | ORAL | 0 refills | Status: AC

## 2019-07-18 NOTE — Progress Notes (Signed)
Ok to schedule.

## 2019-07-18 NOTE — Progress Notes (Signed)
Gastroenterology Pre-Procedure Review  Request Date: 07/18/2019 Requesting Physician: Dr. Hilma Favors, Last TCS 01/2006 done by Dr. Oneida Alar, no report, pt says no polyps  PATIENT REVIEW QUESTIONS: The patient responded to the following health history questions as indicated:    1. Diabetes Melitis: no 2. Joint replacements in the past 12 months: no 3. Major health problems in the past 3 months: no 4. Has an artificial valve or MVP: no 5. Has a defibrillator: no 6. Has been advised in past to take antibiotics in advance of a procedure like teeth cleaning: no 7. Family history of colon cancer: yes, grandfather: age 58  8. Alcohol Use: no 9. Illicit drug Use: no 10. History of sleep apnea: no  11. History of coronary artery or other vascular stents placed within the last 12 months: no 12. History of any prior anesthesia complications: no 13. There is no height or weight on file to calculate BMI.ht: 5'6 wt: 215 lbs    MEDICATIONS & ALLERGIES:    Patient reports the following regarding taking any blood thinners:   Plavix? no Aspirin? yes Coumadin? no Brilinta? no Xarelto? no Eliquis? no Pradaxa? no Savaysa? no Effient? no  Patient confirms/reports the following medications:  Current Outpatient Medications  Medication Sig Dispense Refill  . aspirin EC 81 MG tablet Take 81 mg by mouth daily.    . colchicine 0.6 MG tablet Take 0.6 mg by mouth daily as needed (gout).     Marland Kitchen diltiazem (DILACOR XR) 180 MG 24 hr capsule Take 180 mg by mouth at bedtime.    Marland Kitchen olmesartan (BENICAR) 40 MG tablet Take 40 mg by mouth daily.    . Omega-3 Fatty Acids (FISH OIL) 1200 MG CAPS Take 1,200 mg by mouth daily.     . simvastatin (ZOCOR) 40 MG tablet Take 40 mg by mouth daily with breakfast.     . sildenafil (VIAGRA) 50 MG tablet Take 25 mg by mouth as needed for erectile dysfunction.     No current facility-administered medications for this visit.    Patient confirms/reports the following allergies:  No  Known Allergies  No orders of the defined types were placed in this encounter.   AUTHORIZATION INFORMATION Primary Insurance: [employer],  ID [id number] Pre-Cert / Josem Kaufmann required: No, not required  SCHEDULE INFORMATION: Procedure has been scheduled as follows:  Date: 11/17/2019, Time: 9:30 Location: [facility] with Dr. Oneida Alar  This Gastroenterology Pre-Precedure Review Form is being routed to the following provider(s): Neil Crouch, PA

## 2019-07-18 NOTE — Patient Instructions (Addendum)
[place]   Please notify us immediately if you are diabetic, take iron supplements, or if you are on coumadin or any blood thinners.   Patient Name: Parminder Cupples Date of procedure: 11/17/2019  Time to register at [facility] [facility]: 8:30 Provider: Dr. Oneida Alar   Purchase: MIRALAX 238 gram bottle, 1 FLEET ENEMA, 1 box of DULCOLAX (All over the counter medications)    11/15/2019- 2 Days prior to procedure: START CLEAR LIQUID DIET AFTER YOUR LUNCH MEAL--NO SOLID FOODS!   11/16/2019-- 1 Day prior to procedure:   CLEAR LIQUIDS ALL DAY--NO SOLID FOODS!   Diabetic medication adjustments for today:    At 10:00 AM, take 2 DULCOLAX 21m tablets   At 12:00 PM, Mix 5 teaspoons of Miralax in any 4-6 ounces of CLEAR LIQUIDS (Gatorade) every hour for 5 hours until passing clear, watery stools. Be sure to drink 4 ounces of clear liquid 30 minutes after each dose of Miralax.   At 3:00 PM, take 2 Dulcolax 533mtablets   If stools are not clear & watery by 6:00 PM, take 5 teaspoons of Miralax every 30 minutes until stools are clear (no color)   You must have a complete prep to ensure the most effective cleaning.   CONTINUE CLEAR LIQUIDS ONLY UNTIL MIDNIGHT. Make a conscious effort to drink as much as you can before, during & after the preparation.    NOTHING TO EAT OR DRINK AFTER MIDNIGHT except for your heart, blood pressure & breathing medications. You may take them with a sip of clear liquids.   11/17/2019-- Day of Procedure  Give yourself one Fleet enema about 1 hour prior to leaving for the hospital.   Diabetic medication adjustments for today:   You may take TYLENOL products. Please continue your regular medications unless we have instructed you otherwise.    Please note, on the day of your procedure you MUST be accompanied by an adult who is willing to assume responsibility for you at time of discharge. If you do not have such person with you, your  procedure will have to be rescheduled.                                                             Please leave ALL jewelry at home prior to coming to the hospital for your procedure.   *It is your responsibility to check with your insurance company for the benefits of coverage you have for this procedure. Unfortunately, not all insurance companies have benefits to cover all or part of these types of procedures. It is your responsibility to check your benefits, however we will be glad to assist you with any codes your insurance company may need.   Please note that most insurance companies will not cover a screening colonoscopy for people under the age of 502For example, with some insurance companies you may have benefits for a screening colonoscopy, but if polyps are found the diagnosis will change and then you may have a deductible that will need to be met. Please make sure you check your benefits for screening colonoscopy as well as a diagnostic colonoscopy.    CLEAR LIQUIDS: (NO RED)  Jello Apple Juice White Grape Juice Water  Banana popsicles Kool-Aid Coffee(No cream or milk)  Tea (No  cream or milk) Soft drinks Broth (fat free beef/chicken/vegetable)   Clear liquids allow you to see your fingers on the other side of the glass. Be sure they are NOT RED in color, cloudy, but CLEAR.   Do Not Eat:  Dairy products of any kind Cranberry juice  Tomato or V8 Juice Orange Juice  Grapefruit Juice Red Grape Juice  Solid foods like cereal, oatmeal, yogurt, fruits, vegetables, creamed soups, eggs, bread, etc   HELPFUL HINTS TO MAKE DRINKING EASIER:  -Make sure prep is extremely COLD. Refrigerate the night before. You may also put in freezer.  -You may try mixing Crystal Light or Country Time Lemonade if you prefer. MIx in small amounts. Add more if necessary.  -Trying drinking through a straw.  -Rinse mouth with water or mouthwash  between glasses to remove aftertaste.  -Try sipping on a cold beverage/ice popsicles between glasses of prep.  -Place a piece of sugar-free hard candy in mouth between glasses.  -If you become nauseated, try consuming smaller amounts or stretch out the time between glasses. Stop for 30 minutes to an hour & slowly start back drinking.   Call our office with any questions or concerns at [phone].   Thank You

## 2019-07-19 NOTE — Addendum Note (Signed)
Addended by: Metro Kung on: 07/19/2019 08:13 AM   Modules accepted: Orders, SmartSet

## 2019-07-21 HISTORY — PX: PROSTATE SURGERY: SHX751

## 2019-08-16 DIAGNOSIS — E7849 Other hyperlipidemia: Secondary | ICD-10-CM | POA: Diagnosis not present

## 2019-08-16 DIAGNOSIS — I10 Essential (primary) hypertension: Secondary | ICD-10-CM | POA: Diagnosis not present

## 2019-08-16 DIAGNOSIS — Z6835 Body mass index (BMI) 35.0-35.9, adult: Secondary | ICD-10-CM | POA: Diagnosis not present

## 2019-08-16 DIAGNOSIS — N401 Enlarged prostate with lower urinary tract symptoms: Secondary | ICD-10-CM | POA: Diagnosis not present

## 2019-08-18 DIAGNOSIS — N400 Enlarged prostate without lower urinary tract symptoms: Secondary | ICD-10-CM | POA: Diagnosis not present

## 2019-08-18 DIAGNOSIS — N401 Enlarged prostate with lower urinary tract symptoms: Secondary | ICD-10-CM | POA: Diagnosis not present

## 2019-08-18 DIAGNOSIS — R3912 Poor urinary stream: Secondary | ICD-10-CM | POA: Diagnosis not present

## 2019-08-20 DIAGNOSIS — E7849 Other hyperlipidemia: Secondary | ICD-10-CM | POA: Diagnosis not present

## 2019-08-20 DIAGNOSIS — I129 Hypertensive chronic kidney disease with stage 1 through stage 4 chronic kidney disease, or unspecified chronic kidney disease: Secondary | ICD-10-CM | POA: Diagnosis not present

## 2019-08-20 DIAGNOSIS — N1831 Chronic kidney disease, stage 3a: Secondary | ICD-10-CM | POA: Diagnosis not present

## 2019-08-20 DIAGNOSIS — N401 Enlarged prostate with lower urinary tract symptoms: Secondary | ICD-10-CM | POA: Diagnosis not present

## 2019-09-17 DIAGNOSIS — I129 Hypertensive chronic kidney disease with stage 1 through stage 4 chronic kidney disease, or unspecified chronic kidney disease: Secondary | ICD-10-CM | POA: Diagnosis not present

## 2019-09-17 DIAGNOSIS — E7849 Other hyperlipidemia: Secondary | ICD-10-CM | POA: Diagnosis not present

## 2019-09-17 DIAGNOSIS — N1831 Chronic kidney disease, stage 3a: Secondary | ICD-10-CM | POA: Diagnosis not present

## 2019-10-18 DIAGNOSIS — N1831 Chronic kidney disease, stage 3a: Secondary | ICD-10-CM | POA: Diagnosis not present

## 2019-10-18 DIAGNOSIS — E7849 Other hyperlipidemia: Secondary | ICD-10-CM | POA: Diagnosis not present

## 2019-10-18 DIAGNOSIS — N401 Enlarged prostate with lower urinary tract symptoms: Secondary | ICD-10-CM | POA: Diagnosis not present

## 2019-10-18 DIAGNOSIS — I129 Hypertensive chronic kidney disease with stage 1 through stage 4 chronic kidney disease, or unspecified chronic kidney disease: Secondary | ICD-10-CM | POA: Diagnosis not present

## 2019-11-08 ENCOUNTER — Telehealth: Payer: Self-pay | Admitting: *Deleted

## 2019-11-08 MED ORDER — CLENPIQ 10-3.5-12 MG-GM -GM/160ML PO SOLN: 1.0000 | Freq: Once | ORAL | 0 refills | Status: AC

## 2019-11-08 NOTE — Addendum Note (Signed)
Addended by: Metro Kung on: 11/08/2019 12:30 PM   Modules accepted: Orders

## 2019-11-08 NOTE — Telephone Encounter (Signed)
[facility] sent over fax that they are out of Golytely right now and is requesting a change to Clenpiq.  Routing to LSL as FYI (originally signed off on triage).

## 2019-11-08 NOTE — Telephone Encounter (Signed)
Angie, I may have accidentally signed a cosign order for clenpiq but he should not have it due to kidney disease.   Would recommend miralax prep.

## 2019-11-08 NOTE — Telephone Encounter (Signed)
Called [facility] and told them to disregard our RX for Clenpiq.  Also, called pt and informed him to come by the office to pick up new instructions for Miralax prep.  Pt made aware that all items required for this prep are over the counter.  Pt voiced understanding and was advised to call me if any questions.

## 2019-11-14 ENCOUNTER — Other Ambulatory Visit (HOSPITAL_COMMUNITY)
Admission: RE | Admit: 2019-11-14 | Discharge: 2019-11-14 | Disposition: A | Discharge status: Home / Self Care | Payer: PPO | Source: Ambulatory Visit | Attending: Gastroenterology | Admitting: Gastroenterology

## 2019-11-14 ENCOUNTER — Other Ambulatory Visit: Payer: Self-pay

## 2019-11-14 DIAGNOSIS — Z20822 Contact with and (suspected) exposure to covid-19: Secondary | ICD-10-CM | POA: Insufficient documentation

## 2019-11-14 DIAGNOSIS — Z01812 Encounter for preprocedural laboratory examination: Secondary | ICD-10-CM | POA: Insufficient documentation

## 2019-11-15 ENCOUNTER — Other Ambulatory Visit (HOSPITAL_COMMUNITY): Payer: PPO

## 2019-11-15 LAB — SARS CORONAVIRUS 2 (TAT 6-24 HRS): SARS Coronavirus 2: NEGATIVE

## 2019-11-17 ENCOUNTER — Telehealth: Payer: Self-pay | Admitting: Cardiology

## 2019-11-17 ENCOUNTER — Encounter (HOSPITAL_COMMUNITY)
Admission: RE | Disposition: A | Discharge status: Home / Self Care | Payer: Self-pay | Source: Home / Self Care | Attending: Gastroenterology

## 2019-11-17 ENCOUNTER — Encounter (HOSPITAL_COMMUNITY): Payer: Self-pay | Admitting: Gastroenterology

## 2019-11-17 ENCOUNTER — Ambulatory Visit (HOSPITAL_COMMUNITY)
Admission: RE | Admit: 2019-11-17 | Discharge: 2019-11-17 | Disposition: A | Discharge status: Home / Self Care | Payer: PPO | Attending: Gastroenterology | Admitting: Gastroenterology

## 2019-11-17 ENCOUNTER — Other Ambulatory Visit: Payer: Self-pay

## 2019-11-17 DIAGNOSIS — Z7982 Long term (current) use of aspirin: Secondary | ICD-10-CM | POA: Diagnosis not present

## 2019-11-17 DIAGNOSIS — Z9842 Cataract extraction status, left eye: Secondary | ICD-10-CM | POA: Diagnosis not present

## 2019-11-17 DIAGNOSIS — N401 Enlarged prostate with lower urinary tract symptoms: Secondary | ICD-10-CM | POA: Diagnosis not present

## 2019-11-17 DIAGNOSIS — D120 Benign neoplasm of cecum: Secondary | ICD-10-CM | POA: Insufficient documentation

## 2019-11-17 DIAGNOSIS — R339 Retention of urine, unspecified: Secondary | ICD-10-CM | POA: Insufficient documentation

## 2019-11-17 DIAGNOSIS — D124 Benign neoplasm of descending colon: Secondary | ICD-10-CM | POA: Diagnosis not present

## 2019-11-17 DIAGNOSIS — K648 Other hemorrhoids: Secondary | ICD-10-CM | POA: Insufficient documentation

## 2019-11-17 DIAGNOSIS — Z9841 Cataract extraction status, right eye: Secondary | ICD-10-CM | POA: Insufficient documentation

## 2019-11-17 DIAGNOSIS — Z961 Presence of intraocular lens: Secondary | ICD-10-CM | POA: Insufficient documentation

## 2019-11-17 DIAGNOSIS — Z1211 Encounter for screening for malignant neoplasm of colon: Secondary | ICD-10-CM | POA: Diagnosis not present

## 2019-11-17 DIAGNOSIS — Z79899 Other long term (current) drug therapy: Secondary | ICD-10-CM | POA: Insufficient documentation

## 2019-11-17 DIAGNOSIS — K644 Residual hemorrhoidal skin tags: Secondary | ICD-10-CM | POA: Diagnosis not present

## 2019-11-17 DIAGNOSIS — D122 Benign neoplasm of ascending colon: Secondary | ICD-10-CM | POA: Diagnosis not present

## 2019-11-17 DIAGNOSIS — N1831 Chronic kidney disease, stage 3a: Secondary | ICD-10-CM | POA: Diagnosis not present

## 2019-11-17 DIAGNOSIS — K635 Polyp of colon: Secondary | ICD-10-CM | POA: Diagnosis not present

## 2019-11-17 DIAGNOSIS — Q438 Other specified congenital malformations of intestine: Secondary | ICD-10-CM | POA: Insufficient documentation

## 2019-11-17 DIAGNOSIS — E7800 Pure hypercholesterolemia, unspecified: Secondary | ICD-10-CM | POA: Diagnosis not present

## 2019-11-17 DIAGNOSIS — Z800 Family history of malignant neoplasm of digestive organs: Secondary | ICD-10-CM | POA: Diagnosis not present

## 2019-11-17 DIAGNOSIS — E7849 Other hyperlipidemia: Secondary | ICD-10-CM | POA: Diagnosis not present

## 2019-11-17 DIAGNOSIS — M109 Gout, unspecified: Secondary | ICD-10-CM | POA: Diagnosis not present

## 2019-11-17 DIAGNOSIS — I129 Hypertensive chronic kidney disease with stage 1 through stage 4 chronic kidney disease, or unspecified chronic kidney disease: Secondary | ICD-10-CM | POA: Diagnosis not present

## 2019-11-17 DIAGNOSIS — D123 Benign neoplasm of transverse colon: Secondary | ICD-10-CM | POA: Diagnosis not present

## 2019-11-17 DIAGNOSIS — I10 Essential (primary) hypertension: Secondary | ICD-10-CM | POA: Insufficient documentation

## 2019-11-17 DIAGNOSIS — I472 Ventricular tachycardia: Secondary | ICD-10-CM | POA: Diagnosis not present

## 2019-11-17 HISTORY — DX: Essential (primary) hypertension: I10

## 2019-11-17 HISTORY — PX: COLONOSCOPY: SHX5424

## 2019-11-17 HISTORY — PX: POLYPECTOMY: SHX5525

## 2019-11-17 LAB — BASIC METABOLIC PANEL
Anion gap: 8 (ref 5–15)
BUN: 17 mg/dL (ref 8–23)
CO2: 21 mmol/L — ABNORMAL LOW (ref 22–32)
Calcium: 8.4 mg/dL — ABNORMAL LOW (ref 8.9–10.3)
Chloride: 107 mmol/L (ref 98–111)
Creatinine, Ser: 1.33 mg/dL — ABNORMAL HIGH (ref 0.61–1.24)
GFR calc Af Amer: >60 mL/min (ref >60)
GFR calc non Af Amer: 55 mL/min — ABNORMAL LOW (ref >60)
Glucose, Bld: 121 mg/dL — ABNORMAL HIGH (ref 70–99)
Potassium: 4.4 mmol/L (ref 3.5–5.1)
Sodium: 136 mmol/L (ref 135–145)

## 2019-11-17 LAB — MAGNESIUM: Magnesium: 2.2 mg/dL (ref 1.7–2.4)

## 2019-11-17 SURGERY — COLONOSCOPY
Anesthesia: Moderate Sedation

## 2019-11-17 MED ORDER — MEPERIDINE HCL 100 MG/ML IJ SOLN
INTRAMUSCULAR | Status: DC | PRN
  Administered 2019-11-17 (×3): 25 mg via INTRAVENOUS

## 2019-11-17 MED ORDER — SODIUM CHLORIDE 0.9 % IV SOLN
INTRAVENOUS | Status: DC
  Administered 2019-11-17 (×2): 1000 mL via INTRAVENOUS

## 2019-11-17 MED ORDER — ATROPINE SULFATE 1 MG/ML IJ SOLN
INTRAMUSCULAR | Status: AC
  Filled 2019-11-17: qty 1

## 2019-11-17 MED ORDER — ATROPINE SULFATE 1 MG/ML IJ SOLN
INTRAMUSCULAR | Status: DC | PRN
  Administered 2019-11-17: .5 mg via INTRAVENOUS

## 2019-11-17 MED ORDER — MIDAZOLAM HCL 5 MG/5ML IJ SOLN
INTRAMUSCULAR | Status: AC
  Filled 2019-11-17: qty 10

## 2019-11-17 MED ORDER — MIDAZOLAM HCL 5 MG/5ML IJ SOLN
INTRAMUSCULAR | Status: DC | PRN
  Administered 2019-11-17: 1 mg via INTRAVENOUS
  Administered 2019-11-17 (×2): 2 mg via INTRAVENOUS

## 2019-11-17 MED ORDER — STERILE WATER FOR IRRIGATION IR SOLN
Status: DC | PRN
  Administered 2019-11-17: 2.5 mL

## 2019-11-17 MED ORDER — MEPERIDINE HCL 100 MG/ML IJ SOLN
INTRAMUSCULAR | Status: AC
  Filled 2019-11-17: qty 2

## 2019-11-17 NOTE — Telephone Encounter (Signed)
Patient discussed with Dr Oneida Alar, 13 beat run NSVT during endoscopy asymptomatic and self terminated. GI has ordered BMET and Mg. We will arrange outpatient cardiology appt and notify patient   Zandra Abts MD

## 2019-11-17 NOTE — Discharge Instructions (Signed)
You had 5 small polyps removed. You have external and internal hemorrhoids.    DURING THE PROCEDURE YOU HAD AN ABNORMAL HEART RHYTHM(SINUS BRADYCARDIA, VENTRICULAR TACHYCARDIA). THE DR. Carlyle Dolly, CARDIOLOGY, PH#: [phone], WILL CALL YOU FOR AN APPOINTMENT.  DRINK WATER TO KEEP YOUR URINE LIGHT YELLOW.  FOLLOW A HIGH FIBER DIET. AVOID ITEMS THAT CAUSE BLOATING & GAS. SEE INFO BELOW.   USE PREPARATION H FOUR TIMES  A DAY IF NEEDED TO RELIEVE RECTAL PAIN/PRESSURE/BLEEDING.   YOUR BIOPSY RESULTS WILL BE BACK IN 5 BUSINESS DAYS.  YOUR next colonoscopy WILL BE SCHEDULED BASED ON YOUR FINAL PATHOLOGY REPORT.    Colonoscopy Care After Read the instructions outlined below and refer to this sheet in the next week. These discharge instructions provide you with general information on caring for yourself after you leave the hospital. While your treatment has been planned according to the most current medical practices available, unavoidable complications occasionally occur. If you have any problems or questions after discharge, call DR. Macarius Ruark, [phone].  ACTIVITY  You may resume your regular activity, but move at a slower pace for the next 24 hours.   Take frequent rest periods for the next 24 hours.   Walking will help get rid of the air and reduce the bloated feeling in your belly (abdomen).   No driving for 24 hours (because of the medicine (anesthesia) used during the test).   You may shower.   Do not sign any important legal documents or operate any machinery for 24 hours (because of the anesthesia used during the test).    NUTRITION  Drink plenty of fluids.   You may resume your normal diet as instructed by your doctor.   Begin with a light meal and progress to your normal diet. Heavy or fried foods are harder to digest and may make you feel sick to your stomach (nauseated).   Avoid alcoholic beverages for 24 hours or as instructed.    MEDICATIONS  You may  resume your normal medications.   WHAT YOU CAN EXPECT TODAY  Some feelings of bloating in the abdomen.   Passage of more gas than usual.   Spotting of blood in your stool or on the toilet paper  .  IF YOU HAD POLYPS REMOVED DURING THE COLONOSCOPY:  Eat a soft diet IF YOU HAVE NAUSEA, BLOATING, ABDOMINAL PAIN, OR VOMITING.    FINDING OUT THE RESULTS OF YOUR TEST Not all test results are available during your visit. DR. Oneida Alar WILL CALL YOU WITHIN 14 DAYS OF YOUR PROCEDUE WITH YOUR RESULTS. Do not assume everything is normal if you have not heard from DR. Phillipa Morden, CALL HER OFFICE AT [phone].  SEEK IMMEDIATE MEDICAL ATTENTION AND CALL THE OFFICE: [phone] IF:  You have more than a spotting of blood in your stool.   Your belly is swollen (abdominal distention).   You are nauseated or vomiting.   You have a temperature over 101F.   You have abdominal pain or discomfort that is severe or gets worse throughout the day.   High-Fiber Diet A high-fiber diet changes your normal diet to include more whole grains, legumes, fruits, and vegetables. Changes in the diet involve replacing refined carbohydrates with unrefined foods. The calorie level of the diet is essentially unchanged. The Dietary Reference Intake (recommended amount) for adult males is 38 grams per day. For adult females, it is 25 grams per day. Pregnant and lactating women should consume 28 grams of fiber per day. Fiber is the intact  part of a plant that is not broken down during digestion. Functional fiber is fiber that has been isolated from the plant to provide a beneficial effect in the body.  PURPOSE  Increase stool bulk.   Ease and regulate bowel movements.   Lower cholesterol.   REDUCE RISK OF COLON CANCER  INDICATIONS THAT YOU NEED MORE FIBER  Constipation and hemorrhoids.   Uncomplicated diverticulosis (intestine condition) and irritable bowel syndrome.   Weight management.   As a protective  measure against hardening of the arteries (atherosclerosis), diabetes, and cancer.   GUIDELINES FOR INCREASING FIBER IN THE DIET  Start adding fiber to the diet slowly. A gradual increase of about 5 more grams (2 servings of most fruits or vegetables) per day is best. Too rapid an increase in fiber may result in constipation, flatulence, and bloating.   Drink enough water and fluids to keep your urine clear or pale yellow. Water, juice, or caffeine-free drinks are recommended. Not drinking enough fluid may cause constipation.   Eat a variety of high-fiber foods rather than one type of fiber.   Try to increase your intake of fiber through using high-fiber foods rather than fiber pills or supplements that contain small amounts of fiber.   The goal is to change the types of food eaten. Do not supplement your present diet with high-fiber foods, but replace foods in your present diet.     Polyps, Colon  A polyp is extra tissue that grows inside your body. Colon polyps grow in the large intestine. The large intestine, also called the colon, is part of your digestive system. It is a long, hollow tube at the end of your digestive tract where your body makes and stores stool. Most polyps are not dangerous. They are benign. This means they are not cancerous. But over time, some types of polyps can turn into cancer. Polyps that are smaller than a pea are usually not harmful. But larger polyps could someday become or may already be cancerous. To be safe, doctors remove all polyps and test them.   WHO GETS POLYPS? Anyone can get polyps, but certain people are more likely than others. You may have a greater chance of getting polyps if:  You are over 50.   You have had polyps before.   Someone in your family has had polyps.   Someone in your family has had cancer of the large intestine.   Find out if someone in your family has had polyps. You may also be more likely to get polyps if you:   Eat a lot  of fatty foods   Smoke   Drink alcohol   Do not exercise  Eat too much   PREVENTION There is not one sure way to prevent polyps. You might be able to lower your risk of getting them if you:  Eat more fruits and vegetables and less fatty food.   Do not smoke.   Avoid alcohol.   Exercise every day.   Lose weight if you are overweight.   Eating more calcium and folate can also lower your risk of getting polyps. Some foods that are rich in calcium are milk, cheese, and broccoli. Some foods that are rich in folate are chickpeas, kidney beans, and spinach.

## 2019-11-17 NOTE — Op Note (Signed)
[facility] Patient Name: Noah Cook Procedure Date: 11/17/2019 9:21 AM MRN: [id number] Date of Birth: December 22, 1950 Attending MD: Barney Drain MD, MD CSN: [id number] Age: 69 Admit Type: Outpatient Procedure:                Colonoscopy WITH COLD SNARE POLYPECTOMY Indications:              Screening for colorectal malignant neoplasm Providers:                Barney Drain MD, MD, Lurline Del, RN, Aram Candela Referring MD:             Halford Chessman MD, MD Medicines:                Meperidine 75 mg IV, Midazolam 5 mg IV, Atropine                            0.5 mg IV Complications:            No immediate complications. PT HAVING PVCs. GIVEN                            DEMEROL 75 MG AND VERSED 5 MG IV. PT HAD RUN OF                            NONSUSTAINED VTACH. RHYTHM STRIP REVIEWED BY DR.                            Harl Bowie. PT ALSO HAD SINUS BRADYCARDIA/SBP 65.                            ATROPINE 0.5 MG IV GIVEN. BP INCREASED TO > 110 AND                            HR > 80. NO ADDITIONAL RUNS OF VTACH. Estimated Blood Loss:     Estimated blood loss was minimal. Procedure:                Pre-Anesthesia Assessment:                           - Prior to the procedure, a History and Physical                            was performed, and patient medications and                            allergies were reviewed. The patient's tolerance of                            previous anesthesia was also reviewed. The risks                            and benefits of the procedure and the sedation                            options and risks  were discussed with the patient.                            All questions were answered, and informed consent                            was obtained. Prior Anticoagulants: The patient has                            taken no previous anticoagulant or antiplatelet                            agents except for aspirin. ASA Grade Assessment: II       - A patient with mild systemic disease. After                            reviewing the risks and benefits, the patient was                            deemed in satisfactory condition to undergo the                            procedure. After obtaining informed consent, the                            colonoscope was passed under direct vision.                            Throughout the procedure, the patient's blood                            pressure, pulse, and oxygen saturations were                            monitored continuously. The [id number]                            scope was introduced through the anus and advanced                            to the the cecum, identified by appendiceal orifice                            and ileocecal valve. The colonoscopy was somewhat                            difficult due to a tortuous colon, the patient's                            cardiovascular instability (arrhythmia) and the                            patient's cardiovascular instability (hypotension).  Successful completion of the procedure was aided by                            using scope torsion, managing the patient's medical                            instability and COLOWRAP. The patient tolerated the                            procedure well. The quality of the bowel                            preparation was good. The ileocecal valve,                            appendiceal orifice, and rectum were photographed. Scope In: 10:07:15 AM Scope Out: 10:27:35 AM Scope Withdrawal Time: 0 hours 18 minutes 16 seconds  Total Procedure Duration: 0 hours 20 minutes 20 seconds  Findings:      FIVE sessile polyps were found in the splenic flexure AND DESCENDING       COLON(1:BTL 1), mid transverse colon, proximal ascending colon and       cecum. The polyps were 2 to 7 mm in size. These polyps were removed with       a cold snare. Resection and retrieval  were complete.      External and internal hemorrhoids were found.      The recto-sigmoid colon and sigmoid colon were mildly tortuous. Impression:               - FIVE 2 to 7 mm polyps at the splenic flexure,                            DESCENDING COLON, ASCENDING COLON, in the mid                            transverse colon and in the cecum, removed with a                            cold snare. Resected and retrieved.                           - External and internal hemorrhoids.                           - Tortuous colon. Moderate Sedation:      Moderate (conscious) sedation was administered by the endoscopy nurse       and supervised by the endoscopist. The following parameters were       monitored: oxygen saturation, heart rate, blood pressure, and response       to care. Total physician intraservice time was 40 minutes. Recommendation:           - Patient has a contact number available for                            emergencies. The signs and symptoms of potential  delayed complications were discussed with the                            patient. Return to normal activities tomorrow.                            Written discharge instructions were provided to the                            patient.                           - High fiber diet.                           - Continue present medications.                           - Await pathology results.                           - Repeat colonoscopy date to be determined after                            pending pathology results are reviewed for                            surveillance. Procedure Code(s):        --- Professional ---                           [phone], Colonoscopy, flexible; with removal of                            tumor(s), polyp(s), or other lesion(s) by snare                            technique                           99153, Moderate sedation; each additional 15                             minutes intraservice time                           99153, Moderate sedation; each additional 15                            minutes intraservice time                           G0500, Moderate sedation services provided by the                            same physician or other qualified health care  professional performing a gastrointestinal                            endoscopic service that sedation supports,                            requiring the presence of an independent trained                            observer to assist in the monitoring of the                            patient's level of consciousness and physiological                            status; initial 15 minutes of intra-service time;                            patient age 73 years or older (additional time may                            be reported with [phone], as appropriate) Diagnosis Code(s):        --- Professional ---                           Z12.11, Encounter for screening for malignant                            neoplasm of colon                           K63.5, Polyp of colon                           K64.8, Other hemorrhoids                           Q43.8, Other specified congenital malformations of                            intestine CPT copyright 2019 [employer]. All rights reserved. The codes documented in this report are preliminary and upon coder review may  be revised to meet current compliance requirements. Barney Drain, MD Barney Drain MD, MD 11/17/2019 11:06:24 AM This report has been signed electronically. Number of Addenda: 0

## 2019-11-17 NOTE — H&P (Signed)
Primary Care Physician:  Sharilyn Sites, MD Primary Gastroenterologist:  Dr. Oneida Alar  Pre-Procedure History & Physical: HPI:  Noah Cook is a 69 y.o. male here for COLON CANCER SCREENING.  Past Medical History:  Diagnosis Date  . Gout   . Hypercholesteremia   . Hypertension   . Urinary retention    foley since sept 2014    Past Surgical History:  Procedure Laterality Date  . CATARACT EXTRACTION W/PHACO Left 02/04/2018   Procedure: CATARACT EXTRACTION PHACO AND INTRAOCULAR LENS PLACEMENT (IOC);  Surgeon: Baruch Goldmann, MD;  Location: [facility];  Service: Ophthalmology;  Laterality: Left;  CDE: 2.98  . CATARACT EXTRACTION W/PHACO Right 02/18/2018   Procedure: CATARACT EXTRACTION PHACO AND INTRAOCULAR LENS PLACEMENT (IOC);  Surgeon: Baruch Goldmann, MD;  Location: [facility];  Service: Ophthalmology;  Laterality: Right;  CDE: 4.70  . GREEN LIGHT LASER TURP (TRANSURETHRAL RESECTION OF PROSTATE N/A 06/30/2013   Procedure: GREEN LIGHT LASER TURP (TRANSURETHRAL RESECTION OF PROSTATE WITH CYSTOGRAM;  Surgeon: Fredricka Bonine, MD;  Location: [facility];  Service: Urology;  Laterality: N/A;  . NO PAST SURGERIES    . PROSTATE SURGERY  07/2019    Prior to Admission medications   Medication Sig Start Date End Date Taking? Authorizing Provider  aspirin EC 81 MG tablet Take 81 mg by mouth daily.   Yes [provider]  colchicine 0.6 MG tablet Take 0.6 mg by mouth daily as needed (gout).    Yes [provider]  diltiazem (DILACOR XR) 180 MG 24 hr capsule Take 180 mg by mouth at bedtime.   Yes [provider]  olmesartan (BENICAR) 40 MG tablet Take 40 mg by mouth daily.   Yes [provider]  Omega-3 Fatty Acids (FISH OIL) 1200 MG CAPS Take 1,200 mg by mouth daily.    Yes [provider]  simvastatin (ZOCOR) 40 MG tablet Take 40 mg by mouth daily with breakfast.    Yes [provider]  sildenafil (VIAGRA) 50 MG tablet Take 25 mg by mouth as  needed for erectile dysfunction.    [provider]    Allergies as of 07/19/2019  . (No Known Allergies)    Family History  Problem Relation Age of Onset  . Colon cancer Other     Social History   Socioeconomic History  . Marital status: Married    Spouse name: Not on file  . Number of children: Not on file  . Years of education: Not on file  . Highest education level: Not on file  Occupational History  . Not on file  Tobacco Use  . Smoking status: Never Smoker  . Smokeless tobacco: Never Used  Substance and Sexual Activity  . Alcohol use: No  . Drug use: No  . Sexual activity: Never  Other Topics Concern  . Not on file  Social History Narrative  . Not on file   Social Determinants of Health   Financial Resource Strain:   . Difficulty of Paying Living Expenses:   Food Insecurity:   . Worried About Charity fundraiser in the Last Year:   . Arboriculturist in the Last Year:   Transportation Needs:   . Film/video editor (Medical):   Marland Kitchen Lack of Transportation (Non-Medical):   Physical Activity:   . Days of Exercise per Week:   . Minutes of Exercise per Session:   Stress:   . Feeling of Stress :   Social Connections:   . Frequency  of Communication with Friends and Family:   . Frequency of Social Gatherings with Friends and Family:   . Attends Religious Services:   . Active Member of Clubs or Organizations:   . Attends Archivist Meetings:   Marland Kitchen Marital Status:   Intimate Partner Violence:   . Fear of Current or Ex-Partner:   . Emotionally Abused:   Marland Kitchen Physically Abused:   . Sexually Abused:     Review of Systems: See HPI, otherwise negative ROS   Physical Exam: BP (!) 152/76   Pulse 66   Temp (!) 97.5 F (36.4 C) (Oral)   Resp 18   Ht 5\' 6"  (1.676 m)   Wt 98.9 kg   SpO2 99%   BMI 35.19 kg/m  General:   Alert,  pleasant and cooperative in NAD Head:  Normocephalic and atraumatic. Neck:  Supple; Lungs:  Clear throughout to  auscultation.    Heart:  Regular rate and rhythm. Abdomen:  Soft, nontender and nondistended. Normal bowel sounds, without guarding, and without rebound.   Neurologic:  Alert and  oriented x4;  grossly normal neurologically.  Impression/Plan:    SCREENING  Plan:  1. TCS TODAY DISCUSSED PROCEDURE, BENEFITS, & RISKS: < 1% chance of medication reaction, bleeding, perforation, ASPIRATION, or rupture of spleen/liver requiring surgery to fix it and missed polyps < 1 cm 10-20% of the time.

## 2019-11-20 LAB — SURGICAL PATHOLOGY

## 2019-11-21 DIAGNOSIS — N401 Enlarged prostate with lower urinary tract symptoms: Secondary | ICD-10-CM | POA: Diagnosis not present

## 2019-11-21 DIAGNOSIS — Q6211 Congenital occlusion of ureteropelvic junction: Secondary | ICD-10-CM | POA: Diagnosis not present

## 2019-11-21 DIAGNOSIS — R3912 Poor urinary stream: Secondary | ICD-10-CM | POA: Diagnosis not present

## 2019-12-01 ENCOUNTER — Ambulatory Visit: Payer: PPO | Admitting: Cardiology

## 2019-12-14 ENCOUNTER — Ambulatory Visit: Payer: PPO | Admitting: Cardiology

## 2019-12-18 DIAGNOSIS — N401 Enlarged prostate with lower urinary tract symptoms: Secondary | ICD-10-CM | POA: Diagnosis not present

## 2019-12-18 DIAGNOSIS — E7849 Other hyperlipidemia: Secondary | ICD-10-CM | POA: Diagnosis not present

## 2019-12-18 DIAGNOSIS — N1831 Chronic kidney disease, stage 3a: Secondary | ICD-10-CM | POA: Diagnosis not present

## 2019-12-18 DIAGNOSIS — I129 Hypertensive chronic kidney disease with stage 1 through stage 4 chronic kidney disease, or unspecified chronic kidney disease: Secondary | ICD-10-CM | POA: Diagnosis not present

## 2019-12-28 ENCOUNTER — Other Ambulatory Visit: Payer: Self-pay

## 2019-12-28 ENCOUNTER — Encounter: Payer: Self-pay | Admitting: Cardiology

## 2019-12-28 ENCOUNTER — Ambulatory Visit: Payer: PPO | Admitting: Cardiology

## 2019-12-28 VITALS — BP 130/74 | HR 69 | Ht 66.0 in | Wt 224.8 lb

## 2019-12-28 DIAGNOSIS — I4729 Other ventricular tachycardia: Secondary | ICD-10-CM

## 2019-12-28 DIAGNOSIS — I472 Ventricular tachycardia: Secondary | ICD-10-CM | POA: Diagnosis not present

## 2019-12-28 NOTE — Progress Notes (Signed)
Clinical Summary Noah Cook is a 69 y.o.male seen as new patient for the following medical problems.   1. NSVT - 13 beat run of NSVT noted during endscopy, self terminated. K was 4.4, Mg 2.2 - no history of heart disease - no chest pain. No SOB or DOE. No palpitations.  - somewhat sedentary lifestyle, though no significant change from his baseline  CAD risk factors: mother with heart disease late 28s had CABG, father MI 22, HTN, HL   Past Medical History:  Diagnosis Date  . Gout   . Hypercholesteremia   . Hypertension   . Urinary retention    foley since sept 2014     No Known Allergies   Current Outpatient Medications  Medication Sig Dispense Refill  . aspirin EC 81 MG tablet Take 81 mg by mouth daily.    . colchicine 0.6 MG tablet Take 0.6 mg by mouth daily as needed (gout).     Marland Kitchen diltiazem (DILACOR XR) 180 MG 24 hr capsule Take 180 mg by mouth at bedtime.    Marland Kitchen olmesartan (BENICAR) 40 MG tablet Take 40 mg by mouth daily.    . Omega-3 Fatty Acids (FISH OIL) 1200 MG CAPS Take 1,200 mg by mouth daily.     . sildenafil (VIAGRA) 50 MG tablet Take 25 mg by mouth as needed for erectile dysfunction.    . simvastatin (ZOCOR) 40 MG tablet Take 40 mg by mouth daily with breakfast.      No current facility-administered medications for this visit.     Past Surgical History:  Procedure Laterality Date  . CATARACT EXTRACTION W/PHACO Left 02/04/2018   Procedure: CATARACT EXTRACTION PHACO AND INTRAOCULAR LENS PLACEMENT (IOC);  Surgeon: Baruch Goldmann, MD;  Location: [facility];  Service: Ophthalmology;  Laterality: Left;  CDE: 2.98  . CATARACT EXTRACTION W/PHACO Right 02/18/2018   Procedure: CATARACT EXTRACTION PHACO AND INTRAOCULAR LENS PLACEMENT (IOC);  Surgeon: Baruch Goldmann, MD;  Location: [facility];  Service: Ophthalmology;  Laterality: Right;  CDE: 4.70  . COLONOSCOPY N/A 11/17/2019   Procedure: COLONOSCOPY;  Surgeon: Danie Binder, MD;  Location: [facility];  Service:  Endoscopy;  Laterality: N/A;  9:30  . GREEN LIGHT LASER TURP (TRANSURETHRAL RESECTION OF PROSTATE N/A 06/30/2013   Procedure: GREEN LIGHT LASER TURP (TRANSURETHRAL RESECTION OF PROSTATE WITH CYSTOGRAM;  Surgeon: Fredricka Bonine, MD;  Location: [facility];  Service: Urology;  Laterality: N/A;  . NO PAST SURGERIES    . POLYPECTOMY  11/17/2019   Procedure: POLYPECTOMY;  Surgeon: Danie Binder, MD;  Location: [facility];  Service: Endoscopy;;  cecum,transverse,splenic flexure  . PROSTATE SURGERY  07/2019     No Known Allergies    Family History  Problem Relation Age of Onset  . Colon cancer Other      Social History Noah Cook reports that he has never smoked. He has never used smokeless tobacco. Noah Cook reports no history of alcohol use.   Review of Systems CONSTITUTIONAL: No weight loss, fever, chills, weakness or fatigue.  HEENT: Eyes: No visual loss, blurred vision, double vision or yellow sclerae.No hearing loss, sneezing, congestion, runny nose or sore throat.  SKIN: No rash or itching.  CARDIOVASCULAR: per hpi RESPIRATORY: No shortness of breath, cough or sputum.  GASTROINTESTINAL: No anorexia, nausea, vomiting or diarrhea. No abdominal pain or blood.  GENITOURINARY: No burning on urination, no polyuria NEUROLOGICAL: No headache, dizziness, syncope, paralysis, ataxia, numbness or tingling in the extremities. No change in  bowel or bladder control.  MUSCULOSKELETAL: No muscle, back pain, joint pain or stiffness.  LYMPHATICS: No enlarged nodes. No history of splenectomy.  PSYCHIATRIC: No history of depression or anxiety.  ENDOCRINOLOGIC: No reports of sweating, cold or heat intolerance. No polyuria or polydipsia.  Marland Kitchen   Physical Examination Today's Vitals   12/28/19 0846  BP: 130/74  Pulse: 69  SpO2: 95%  Weight: 224 lb 12.8 oz (102 kg)  Height: 5\' 6"  (1.676 m)   Body mass index is 36.28 kg/m.  Gen: resting comfortably, no acute distress HEENT: no  scleral icterus, pupils equal round and reactive, no palptable cervical adenopathy,  CV: RRR, no m/r/g, no jvd Resp: Clear to auscultation bilaterally GI: abdomen is soft, non-tender, non-distended, normal bowel sounds, no hepatosplenomegaly MSK: extremities are warm, no edema.  Skin: warm, no rash Neuro:  no focal deficits Psych: appropriate affect      Assessment and Plan  1. NSVT - incidentally noted during recent endoscopy, 13 beats - no palpitaions, no chest pains or DOE/SOB, no presyncope or syncope - unclear if indicator of underlying structrual heart disease - will plan for echo, pending results would plan for lexiscan. Cannot run on treadmill due to chronic knee pains.  - EKG today shows NSR     Arnoldo Lenis, M.D

## 2019-12-28 NOTE — Patient Instructions (Signed)
Your physician recommends that you schedule a follow-up appointment in: PENDING TEST RESULTS WITH DR [facility]  Your physician recommends that you continue on your current medications as directed. Please refer to the Current Medication list given to you today.  Your physician has requested that you have an echocardiogram. Echocardiography is a painless test that uses sound waves to create images of your heart. It provides your doctor with information about the size and shape of your heart and how well your heart's chambers and valves are working. This procedure takes approximately one hour. There are no restrictions for this procedure.   Thank you for choosing [facility]!!

## 2020-01-16 ENCOUNTER — Ambulatory Visit (INDEPENDENT_AMBULATORY_CARE_PROVIDER_SITE_OTHER): Payer: PPO

## 2020-01-16 DIAGNOSIS — I472 Ventricular tachycardia: Secondary | ICD-10-CM

## 2020-01-16 DIAGNOSIS — I4729 Other ventricular tachycardia: Secondary | ICD-10-CM

## 2020-01-17 DIAGNOSIS — I129 Hypertensive chronic kidney disease with stage 1 through stage 4 chronic kidney disease, or unspecified chronic kidney disease: Secondary | ICD-10-CM | POA: Diagnosis not present

## 2020-01-17 DIAGNOSIS — N401 Enlarged prostate with lower urinary tract symptoms: Secondary | ICD-10-CM | POA: Diagnosis not present

## 2020-01-17 DIAGNOSIS — E7849 Other hyperlipidemia: Secondary | ICD-10-CM | POA: Diagnosis not present

## 2020-01-17 DIAGNOSIS — N1831 Chronic kidney disease, stage 3a: Secondary | ICD-10-CM | POA: Diagnosis not present

## 2020-01-26 ENCOUNTER — Telehealth: Payer: Self-pay | Admitting: *Deleted

## 2020-01-26 DIAGNOSIS — I4729 Other ventricular tachycardia: Secondary | ICD-10-CM

## 2020-01-26 NOTE — Telephone Encounter (Signed)
LM to return call.

## 2020-01-26 NOTE — Telephone Encounter (Signed)
-----   Message from Arnoldo Lenis, MD sent at 01/24/2020  9:37 AM EDT ----- Echo looks good, normal heart function. Can we order a lexiscan for nonsutained ventricular tachycardia  J BrancH MD

## 2020-01-30 ENCOUNTER — Telehealth: Payer: Self-pay | Admitting: Cardiology

## 2020-01-30 DIAGNOSIS — H35033 Hypertensive retinopathy, bilateral: Secondary | ICD-10-CM | POA: Diagnosis not present

## 2020-01-30 DIAGNOSIS — H524 Presbyopia: Secondary | ICD-10-CM | POA: Diagnosis not present

## 2020-01-30 NOTE — Telephone Encounter (Signed)
Pt agreeable to lexiscan - orders placed - pt voiced understanding of stress test instructions - forward to schedulers

## 2020-01-30 NOTE — Telephone Encounter (Signed)
Pre-cert Verification for the following procedure    Noah Cook   DATE: 02/05/2020  [facility]

## 2020-02-05 ENCOUNTER — Encounter (HOSPITAL_BASED_OUTPATIENT_CLINIC_OR_DEPARTMENT_OTHER)
Admission: RE | Admit: 2020-02-05 | Discharge: 2020-02-05 | Disposition: A | Discharge status: Home / Self Care | Payer: PPO | Source: Ambulatory Visit | Attending: Cardiology | Admitting: Cardiology

## 2020-02-05 ENCOUNTER — Other Ambulatory Visit: Payer: Self-pay

## 2020-02-05 ENCOUNTER — Encounter (HOSPITAL_COMMUNITY)
Admission: RE | Admit: 2020-02-05 | Discharge: 2020-02-05 | Disposition: A | Discharge status: Home / Self Care | Payer: PPO | Source: Ambulatory Visit | Attending: Cardiology | Admitting: Cardiology

## 2020-02-05 DIAGNOSIS — I472 Ventricular tachycardia: Secondary | ICD-10-CM | POA: Insufficient documentation

## 2020-02-05 DIAGNOSIS — R109 Unspecified abdominal pain: Secondary | ICD-10-CM | POA: Diagnosis not present

## 2020-02-05 DIAGNOSIS — I4729 Other ventricular tachycardia: Secondary | ICD-10-CM

## 2020-02-05 LAB — NM MYOCAR MULTI W/SPECT W/WALL MOTION / EF
LV dias vol: 103 mL (ref 62–150)
LV sys vol: 44 mL
Peak HR: 99 {beats}/min
RATE: 0.37
Rest HR: 55 {beats}/min
SDS: 3
SRS: 5
SSS: 8
TID: 0.88

## 2020-02-05 MED ORDER — TECHNETIUM TC 99M TETROFOSMIN IV KIT
10.0000 | PACK | Freq: Once | INTRAVENOUS | Status: AC | PRN
  Administered 2020-02-05: 11 via INTRAVENOUS

## 2020-02-05 MED ORDER — TECHNETIUM TC 99M TETROFOSMIN IV KIT
30.0000 | PACK | Freq: Once | INTRAVENOUS | Status: AC | PRN
  Administered 2020-02-05: 30 via INTRAVENOUS

## 2020-02-05 MED ORDER — SODIUM CHLORIDE FLUSH 0.9 % IV SOLN
INTRAVENOUS | Status: AC
  Administered 2020-02-05: 10 mL via INTRAVENOUS
  Filled 2020-02-05: qty 10

## 2020-02-05 MED ORDER — REGADENOSON 0.4 MG/5ML IV SOLN
INTRAVENOUS | Status: AC
  Administered 2020-02-05: 0.4 mg via INTRAVENOUS
  Filled 2020-02-05: qty 5

## 2020-02-14 ENCOUNTER — Telehealth: Payer: Self-pay | Admitting: *Deleted

## 2020-02-14 NOTE — Telephone Encounter (Signed)
-----   Message from Merlene Laughter, RN sent at 02/09/2020  4:04 PM EDT -----  ----- Message ----- From: Arnoldo Lenis, MD Sent: 02/09/2020   3:49 PM EDT To: Merlene Laughter, RN  Stress test shows a possible very small blockage at the very tip of the heart. Its such a small area that it is not of any significant risk, but could trigger some of his occasoinal abnormal heart rhythm. Continue current meds, f/u 2 months  Zandra Abts MD

## 2020-02-14 NOTE — Telephone Encounter (Signed)
Patient informed. Copy sent to PCP °

## 2020-02-16 DIAGNOSIS — E7849 Other hyperlipidemia: Secondary | ICD-10-CM | POA: Diagnosis not present

## 2020-02-16 DIAGNOSIS — N1831 Chronic kidney disease, stage 3a: Secondary | ICD-10-CM | POA: Diagnosis not present

## 2020-02-16 DIAGNOSIS — N401 Enlarged prostate with lower urinary tract symptoms: Secondary | ICD-10-CM | POA: Diagnosis not present

## 2020-02-16 DIAGNOSIS — I129 Hypertensive chronic kidney disease with stage 1 through stage 4 chronic kidney disease, or unspecified chronic kidney disease: Secondary | ICD-10-CM | POA: Diagnosis not present

## 2020-02-22 DIAGNOSIS — Z0001 Encounter for general adult medical examination with abnormal findings: Secondary | ICD-10-CM | POA: Diagnosis not present

## 2020-02-22 DIAGNOSIS — Z6834 Body mass index (BMI) 34.0-34.9, adult: Secondary | ICD-10-CM | POA: Diagnosis not present

## 2020-02-22 DIAGNOSIS — E6609 Other obesity due to excess calories: Secondary | ICD-10-CM | POA: Diagnosis not present

## 2020-02-22 DIAGNOSIS — R7309 Other abnormal glucose: Secondary | ICD-10-CM | POA: Diagnosis not present

## 2020-02-22 DIAGNOSIS — Z23 Encounter for immunization: Secondary | ICD-10-CM | POA: Diagnosis not present

## 2020-02-22 DIAGNOSIS — Z1389 Encounter for screening for other disorder: Secondary | ICD-10-CM | POA: Diagnosis not present

## 2020-02-22 DIAGNOSIS — N401 Enlarged prostate with lower urinary tract symptoms: Secondary | ICD-10-CM | POA: Diagnosis not present

## 2020-02-22 DIAGNOSIS — I10 Essential (primary) hypertension: Secondary | ICD-10-CM | POA: Diagnosis not present

## 2020-02-22 DIAGNOSIS — I129 Hypertensive chronic kidney disease with stage 1 through stage 4 chronic kidney disease, or unspecified chronic kidney disease: Secondary | ICD-10-CM | POA: Diagnosis not present

## 2020-02-22 DIAGNOSIS — M5431 Sciatica, right side: Secondary | ICD-10-CM | POA: Diagnosis not present

## 2020-02-22 DIAGNOSIS — E782 Mixed hyperlipidemia: Secondary | ICD-10-CM | POA: Diagnosis not present

## 2020-03-19 DIAGNOSIS — I129 Hypertensive chronic kidney disease with stage 1 through stage 4 chronic kidney disease, or unspecified chronic kidney disease: Secondary | ICD-10-CM | POA: Diagnosis not present

## 2020-03-19 DIAGNOSIS — N401 Enlarged prostate with lower urinary tract symptoms: Secondary | ICD-10-CM | POA: Diagnosis not present

## 2020-03-19 DIAGNOSIS — N1831 Chronic kidney disease, stage 3a: Secondary | ICD-10-CM | POA: Diagnosis not present

## 2020-03-19 DIAGNOSIS — E7849 Other hyperlipidemia: Secondary | ICD-10-CM | POA: Diagnosis not present

## 2020-04-18 DIAGNOSIS — N1831 Chronic kidney disease, stage 3a: Secondary | ICD-10-CM | POA: Diagnosis not present

## 2020-04-18 DIAGNOSIS — E7849 Other hyperlipidemia: Secondary | ICD-10-CM | POA: Diagnosis not present

## 2020-04-18 DIAGNOSIS — N401 Enlarged prostate with lower urinary tract symptoms: Secondary | ICD-10-CM | POA: Diagnosis not present

## 2020-04-18 DIAGNOSIS — I129 Hypertensive chronic kidney disease with stage 1 through stage 4 chronic kidney disease, or unspecified chronic kidney disease: Secondary | ICD-10-CM | POA: Diagnosis not present

## 2020-05-03 ENCOUNTER — Encounter: Payer: Self-pay | Admitting: Cardiology

## 2020-05-03 ENCOUNTER — Ambulatory Visit: Payer: PPO | Admitting: Cardiology

## 2020-05-03 ENCOUNTER — Other Ambulatory Visit: Payer: Self-pay

## 2020-05-03 VITALS — BP 147/71 | HR 69 | Ht 66.0 in | Wt 219.0 lb

## 2020-05-03 DIAGNOSIS — I472 Ventricular tachycardia: Secondary | ICD-10-CM

## 2020-05-03 DIAGNOSIS — I4729 Other ventricular tachycardia: Secondary | ICD-10-CM

## 2020-05-03 NOTE — Patient Instructions (Signed)

## 2020-05-03 NOTE — Progress Notes (Signed)
Clinical Summary Mr. Huish is a 69 y.o.male seen today for follow up of the following medical problems.   1. NSVT - 13 beat run of NSVT noted during endscopy, self terminated. K was 4.4, Mg 2.2 - no history of heart disease - no chest pain. No SOB or DOE. No palpitations.  - somewhat sedentary lifestyle, though no significant change from his baseline  CAD risk factors: mother with heart disease late 16s had CABG, father MI 77, HTN, HL  12/2019 echo LVEF 64-33%, grade I diastolic dysfunction. Aortic root mildly dilated 4.3 cm 01/2020 nuclear stress: apical infarct with mild ischemia. Overall low risk.  -no recent palpitations, no chest pains or SOB/DOE    SH: coaches middle football, coaches middle school baseball  Past Medical History:  Diagnosis Date  . Gout   . Hypercholesteremia   . Hypertension   . Urinary retention    foley since sept 2014     No Known Allergies   Current Outpatient Medications  Medication Sig Dispense Refill  . aspirin EC 81 MG tablet Take 81 mg by mouth daily.    . colchicine 0.6 MG tablet Take 0.6 mg by mouth daily as needed (gout).     Marland Kitchen diltiazem (DILACOR XR) 180 MG 24 hr capsule Take 180 mg by mouth at bedtime.    Marland Kitchen olmesartan (BENICAR) 40 MG tablet Take 40 mg by mouth daily.    . Omega-3 Fatty Acids (FISH OIL) 1200 MG CAPS Take 1,200 mg by mouth daily.     . sildenafil (VIAGRA) 50 MG tablet Take 25 mg by mouth as needed for erectile dysfunction.    . simvastatin (ZOCOR) 40 MG tablet Take 40 mg by mouth daily with breakfast.      No current facility-administered medications for this visit.     Past Surgical History:  Procedure Laterality Date  . CATARACT EXTRACTION W/PHACO Left 02/04/2018   Procedure: CATARACT EXTRACTION PHACO AND INTRAOCULAR LENS PLACEMENT (IOC);  Surgeon: Baruch Goldmann, MD;  Location: [facility];  Service: Ophthalmology;  Laterality: Left;  CDE: 2.98  . CATARACT EXTRACTION W/PHACO Right 02/18/2018   Procedure:  CATARACT EXTRACTION PHACO AND INTRAOCULAR LENS PLACEMENT (IOC);  Surgeon: Baruch Goldmann, MD;  Location: [facility];  Service: Ophthalmology;  Laterality: Right;  CDE: 4.70  . COLONOSCOPY N/A 11/17/2019   Procedure: COLONOSCOPY;  Surgeon: Danie Binder, MD;  Location: [facility];  Service: Endoscopy;  Laterality: N/A;  9:30  . GREEN LIGHT LASER TURP (TRANSURETHRAL RESECTION OF PROSTATE N/A 06/30/2013   Procedure: GREEN LIGHT LASER TURP (TRANSURETHRAL RESECTION OF PROSTATE WITH CYSTOGRAM;  Surgeon: Fredricka Bonine, MD;  Location: [facility];  Service: Urology;  Laterality: N/A;  . NO PAST SURGERIES    . POLYPECTOMY  11/17/2019   Procedure: POLYPECTOMY;  Surgeon: Danie Binder, MD;  Location: [facility];  Service: Endoscopy;;  cecum,transverse,splenic flexure  . PROSTATE SURGERY  07/2019     No Known Allergies    Family History  Problem Relation Age of Onset  . Colon cancer Other      Social History Mr. Saiz reports that he has never smoked. He has never used smokeless tobacco. Mr. Jian reports no history of alcohol use.   Review of Systems CONSTITUTIONAL: No weight loss, fever, chills, weakness or fatigue.  HEENT: Eyes: No visual loss, blurred vision, double vision or yellow sclerae.No hearing loss, sneezing, congestion, runny nose or sore throat.  SKIN: No rash or itching.  CARDIOVASCULAR: per  hpi RESPIRATORY: No shortness of breath, cough or sputum.  GASTROINTESTINAL: No anorexia, nausea, vomiting or diarrhea. No abdominal pain or blood.  GENITOURINARY: No burning on urination, no polyuria NEUROLOGICAL: No headache, dizziness, syncope, paralysis, ataxia, numbness or tingling in the extremities. No change in bowel or bladder control.  MUSCULOSKELETAL: No muscle, back pain, joint pain or stiffness.  LYMPHATICS: No enlarged nodes. No history of splenectomy.  PSYCHIATRIC: No history of depression or anxiety.  ENDOCRINOLOGIC: No reports of sweating, cold or heat  intolerance. No polyuria or polydipsia.  Marland Kitchen   Physical Examination Today's Vitals   05/03/20 1600  BP: (!) 147/71  Pulse: 69  SpO2: 97%  Weight: 219 lb (99.3 kg)  Height: 5\' 6"  (1.676 m)   Body mass index is 35.35 kg/m.  Gen: resting comfortably, no acute distress HEENT: no scleral icterus, pupils equal round and reactive, no palptable cervical adenopathy,  CV: RRR, no m/r/g ,no jvd Resp: Clear to auscultation bilaterally GI: abdomen is soft, non-tender, non-distended, normal bowel sounds, no hepatosplenomegaly MSK: extremities are warm, no edema.  Skin: warm, no rash Neuro:  no focal deficits Psych: appropriate affect   Diagnostic Studies  12/2019 echo IMPRESSIONS    1. Left ventricular ejection fraction, by estimation, is 60 to 65%. The  left ventricle has normal function. The left ventricle has no regional  wall motion abnormalities. There is mild left ventricular hypertrophy.  Left ventricular diastolic parameters  are consistent with Grade I diastolic dysfunction (impaired relaxation).  2. Right ventricular systolic function is normal. The right ventricular  size is normal. There is normal pulmonary artery systolic pressure.  3. The mitral valve is normal in structure. No evidence of mitral valve  regurgitation. No evidence of mitral stenosis.  4. The aortic valve is tricuspid. Aortic valve regurgitation is not  visualized. No aortic stenosis is present.  5. Aortic dilatation noted. There is mild dilatation of the aortic root  measuring 43 mm.   01/2020 nuclear stress  There was no ST segment deviation noted during stress.  This is a low risk study.  Findings consistent with prior apical myocardial infarction with mild peri-infarct ischemia.  The left ventricular ejection fraction is normal (55-65%).     Assessment and Plan  1. NSVT - incidentally noted during recent endoscopy, 13 beats - no palpitaions, no chest pains or DOE/SOB, no presyncope or  syncope - overall benign workup for underlying heart disease with echo, low risk nuclear stress - continue to monitor at this time  F/u 1 year        Arnoldo Lenis, M.D

## 2020-05-20 DIAGNOSIS — Q6239 Other obstructive defects of renal pelvis and ureter: Secondary | ICD-10-CM | POA: Diagnosis not present

## 2020-05-20 DIAGNOSIS — N401 Enlarged prostate with lower urinary tract symptoms: Secondary | ICD-10-CM | POA: Diagnosis not present

## 2020-05-20 DIAGNOSIS — R3914 Feeling of incomplete bladder emptying: Secondary | ICD-10-CM | POA: Diagnosis not present

## 2020-05-20 DIAGNOSIS — N200 Calculus of kidney: Secondary | ICD-10-CM | POA: Diagnosis not present

## 2020-06-18 DIAGNOSIS — E7849 Other hyperlipidemia: Secondary | ICD-10-CM | POA: Diagnosis not present

## 2020-06-18 DIAGNOSIS — N1831 Chronic kidney disease, stage 3a: Secondary | ICD-10-CM | POA: Diagnosis not present

## 2020-06-18 DIAGNOSIS — N401 Enlarged prostate with lower urinary tract symptoms: Secondary | ICD-10-CM | POA: Diagnosis not present

## 2020-06-18 DIAGNOSIS — I129 Hypertensive chronic kidney disease with stage 1 through stage 4 chronic kidney disease, or unspecified chronic kidney disease: Secondary | ICD-10-CM | POA: Diagnosis not present

## 2020-10-16 DIAGNOSIS — I129 Hypertensive chronic kidney disease with stage 1 through stage 4 chronic kidney disease, or unspecified chronic kidney disease: Secondary | ICD-10-CM | POA: Diagnosis not present

## 2020-10-16 DIAGNOSIS — N401 Enlarged prostate with lower urinary tract symptoms: Secondary | ICD-10-CM | POA: Diagnosis not present

## 2020-10-16 DIAGNOSIS — N1831 Chronic kidney disease, stage 3a: Secondary | ICD-10-CM | POA: Diagnosis not present

## 2020-10-16 DIAGNOSIS — E7849 Other hyperlipidemia: Secondary | ICD-10-CM | POA: Diagnosis not present

## 2020-11-28 DIAGNOSIS — Z0000 Encounter for general adult medical examination without abnormal findings: Secondary | ICD-10-CM | POA: Diagnosis not present

## 2020-11-28 DIAGNOSIS — Z6835 Body mass index (BMI) 35.0-35.9, adult: Secondary | ICD-10-CM | POA: Diagnosis not present

## 2020-11-28 DIAGNOSIS — E6609 Other obesity due to excess calories: Secondary | ICD-10-CM | POA: Diagnosis not present

## 2020-11-28 DIAGNOSIS — Z1389 Encounter for screening for other disorder: Secondary | ICD-10-CM | POA: Diagnosis not present

## 2020-11-28 DIAGNOSIS — Z1331 Encounter for screening for depression: Secondary | ICD-10-CM | POA: Diagnosis not present

## 2020-12-17 DIAGNOSIS — I129 Hypertensive chronic kidney disease with stage 1 through stage 4 chronic kidney disease, or unspecified chronic kidney disease: Secondary | ICD-10-CM | POA: Diagnosis not present

## 2020-12-17 DIAGNOSIS — N401 Enlarged prostate with lower urinary tract symptoms: Secondary | ICD-10-CM | POA: Diagnosis not present

## 2020-12-17 DIAGNOSIS — E7849 Other hyperlipidemia: Secondary | ICD-10-CM | POA: Diagnosis not present

## 2020-12-17 DIAGNOSIS — N1831 Chronic kidney disease, stage 3a: Secondary | ICD-10-CM | POA: Diagnosis not present

## 2021-01-28 DIAGNOSIS — H524 Presbyopia: Secondary | ICD-10-CM | POA: Diagnosis not present

## 2021-02-10 DIAGNOSIS — M109 Gout, unspecified: Secondary | ICD-10-CM | POA: Diagnosis not present

## 2021-02-10 DIAGNOSIS — N1831 Chronic kidney disease, stage 3a: Secondary | ICD-10-CM | POA: Diagnosis not present

## 2021-02-10 DIAGNOSIS — E875 Hyperkalemia: Secondary | ICD-10-CM | POA: Diagnosis not present

## 2021-02-10 DIAGNOSIS — E785 Hyperlipidemia, unspecified: Secondary | ICD-10-CM | POA: Diagnosis not present

## 2021-02-10 DIAGNOSIS — I129 Hypertensive chronic kidney disease with stage 1 through stage 4 chronic kidney disease, or unspecified chronic kidney disease: Secondary | ICD-10-CM | POA: Diagnosis not present

## 2021-02-16 DIAGNOSIS — N1831 Chronic kidney disease, stage 3a: Secondary | ICD-10-CM | POA: Diagnosis not present

## 2021-02-16 DIAGNOSIS — I129 Hypertensive chronic kidney disease with stage 1 through stage 4 chronic kidney disease, or unspecified chronic kidney disease: Secondary | ICD-10-CM | POA: Diagnosis not present

## 2021-02-16 DIAGNOSIS — E782 Mixed hyperlipidemia: Secondary | ICD-10-CM | POA: Diagnosis not present

## 2021-05-05 DIAGNOSIS — Z23 Encounter for immunization: Secondary | ICD-10-CM | POA: Diagnosis not present

## 2021-06-04 ENCOUNTER — Encounter: Payer: Self-pay | Admitting: *Deleted

## 2021-06-04 ENCOUNTER — Encounter: Payer: Self-pay | Admitting: Cardiology

## 2021-06-04 ENCOUNTER — Ambulatory Visit: Payer: PPO | Admitting: Cardiology

## 2021-06-04 VITALS — BP 120/80 | HR 72 | Ht 66.0 in | Wt 223.8 lb

## 2021-06-04 DIAGNOSIS — I10 Essential (primary) hypertension: Secondary | ICD-10-CM | POA: Diagnosis not present

## 2021-06-04 DIAGNOSIS — E782 Mixed hyperlipidemia: Secondary | ICD-10-CM | POA: Diagnosis not present

## 2021-06-04 DIAGNOSIS — I4729 Other ventricular tachycardia: Secondary | ICD-10-CM

## 2021-06-04 NOTE — Progress Notes (Signed)
Clinical Summary Mr. Wymer is a 70 y.o.male seen today for follow up of the following medical problems.    1. NSVT - 13 beat run of NSVT noted during endscopy, self terminated. K was 4.4, Mg 2.2    CAD risk factors: mother with heart disease late 37s had CABG, father MI 44, HTN, HL   12/2019 echo LVEF 61-60%, grade I diastolic dysfunction. Aortic root mildly dilated 4.3 cm 01/2020 nuclear stress: apical infarct with mild ischemia. Overall low risk.  -no recent palpitations, no chest pains or SOB/DOE   - no recent palpitations - no chest pain, no SOB/DOE   2. HTN - compliant with meds  3. Hyperlipidemia - compliant with statin - recent labs with pcp     SH: coaches middle school football, coaches middle school baseball   Past Medical History:  Diagnosis Date   Gout    Hypercholesteremia    Hypertension    Urinary retention    foley since sept 2014     No Known Allergies   Current Outpatient Medications  Medication Sig Dispense Refill   aspirin EC 81 MG tablet Take 81 mg by mouth daily.     colchicine 0.6 MG tablet Take 0.6 mg by mouth daily as needed (gout).      diltiazem (CARDIZEM CD) 240 MG 24 hr capsule      olmesartan (BENICAR) 40 MG tablet Take 40 mg by mouth daily.     Omega-3 Fatty Acids (FISH OIL) 1200 MG CAPS Take 1,200 mg by mouth daily.      simvastatin (ZOCOR) 40 MG tablet Take 40 mg by mouth daily with breakfast.      No current facility-administered medications for this visit.     Past Surgical History:  Procedure Laterality Date   CATARACT EXTRACTION W/PHACO Left 02/04/2018   Procedure: CATARACT EXTRACTION PHACO AND INTRAOCULAR LENS PLACEMENT (IOC);  Surgeon: Baruch Goldmann, MD;  Location: [facility];  Service: Ophthalmology;  Laterality: Left;  CDE: 2.98   CATARACT EXTRACTION W/PHACO Right 02/18/2018   Procedure: CATARACT EXTRACTION PHACO AND INTRAOCULAR LENS PLACEMENT (IOC);  Surgeon: Baruch Goldmann, MD;  Location: [facility];  Service:  Ophthalmology;  Laterality: Right;  CDE: 4.70   COLONOSCOPY N/A 11/17/2019   Procedure: COLONOSCOPY;  Surgeon: Danie Binder, MD;  [facility]: [facility];  Service: Endoscopy;  Laterality: N/A;  9:30   GREEN LIGHT LASER TURP (TRANSURETHRAL RESECTION OF PROSTATE N/A 06/30/2013   Procedure: GREEN LIGHT LASER TURP (TRANSURETHRAL RESECTION OF PROSTATE WITH CYSTOGRAM;  Surgeon: Fredricka Bonine, MD;  Location: [facility];  Service: Urology;  Laterality: N/A;   NO PAST SURGERIES     POLYPECTOMY  11/17/2019   Procedure: POLYPECTOMY;  Surgeon: Danie Binder, MD;  Location: [facility];  Service: Endoscopy;;  cecum,transverse,splenic flexure   PROSTATE SURGERY  07/2019     No Known Allergies    Family History  Problem Relation Age of Onset   Colon cancer Other      Social History Mr. Kemmerer reports that he has never smoked. He has never used smokeless tobacco. Mr. Gustafson reports no history of alcohol use.   Review of Systems CONSTITUTIONAL: No weight loss, fever, chills, weakness or fatigue.  HEENT: Eyes: No visual loss, blurred vision, double vision or yellow sclerae.No hearing loss, sneezing, congestion, runny nose or sore throat.  SKIN: No rash or itching.  CARDIOVASCULAR: per hpi RESPIRATORY: No shortness of breath, cough or sputum.  GASTROINTESTINAL: No anorexia, nausea,  vomiting or diarrhea. No abdominal pain or blood.  GENITOURINARY: No burning on urination, no polyuria NEUROLOGICAL: No headache, dizziness, syncope, paralysis, ataxia, numbness or tingling in the extremities. No change in bowel or bladder control.  MUSCULOSKELETAL: No muscle, back pain, joint pain or stiffness.  LYMPHATICS: No enlarged nodes. No history of splenectomy.  PSYCHIATRIC: No history of depression or anxiety.  ENDOCRINOLOGIC: No reports of sweating, cold or heat intolerance. No polyuria or polydipsia.  Marland Kitchen   Physical Examination Today's Vitals   06/04/21 0818  BP: 120/80  Pulse: 72   SpO2: 98%  Weight: 223 lb 12.8 oz (101.5 kg)  Height: 5\' 6"  (1.676 m)   Body mass index is 36.12 kg/m.  Gen: resting comfortably, no acute distress HEENT: no scleral icterus, pupils equal round and reactive, no palptable cervical adenopathy,  CV: RRR, no mr/g no jvd Resp: Clear to auscultation bilaterally GI: abdomen is soft, non-tender, non-distended, normal bowel sounds, no hepatosplenomegaly MSK: extremities are warm, no edema.  Skin: warm, no rash Neuro:  no focal deficits Psych: appropriate affect   Diagnostic Studies  12/2019 echo IMPRESSIONS     1. Left ventricular ejection fraction, by estimation, is 60 to 65%. The  left ventricle has normal function. The left ventricle has no regional  wall motion abnormalities. There is mild left ventricular hypertrophy.  Left ventricular diastolic parameters  are consistent with Grade I diastolic dysfunction (impaired relaxation).   2. Right ventricular systolic function is normal. The right ventricular  size is normal. There is normal pulmonary artery systolic pressure.   3. The mitral valve is normal in structure. No evidence of mitral valve  regurgitation. No evidence of mitral stenosis.   4. The aortic valve is tricuspid. Aortic valve regurgitation is not  visualized. No aortic stenosis is present.   5. Aortic dilatation noted. There is mild dilatation of the aortic root  measuring 43 mm.    01/2020 nuclear stress There was no ST segment deviation noted during stress. This is a low risk study. Findings consistent with prior apical myocardial infarction with mild peri-infarct ischemia. The left ventricular ejection fraction is normal (55-65%).     Assessment and Plan  1. NSVT - incidentally noted during recent endoscopy, 13 beats - overall benign workup for underlying heart disease with echo, low risk nuclear stress -no significant symptoms, continue to monitor - EKG today shows SR, RBBB  2. HTN - at goal, continue  current meds   3. Hyperlipidemia - request labs from pcp, continue statin       Arnoldo Lenis, M.D.

## 2021-06-04 NOTE — Patient Instructions (Signed)

## 2021-07-10 DIAGNOSIS — N130 Hydronephrosis with ureteropelvic junction obstruction: Secondary | ICD-10-CM | POA: Diagnosis not present

## 2021-07-10 DIAGNOSIS — N401 Enlarged prostate with lower urinary tract symptoms: Secondary | ICD-10-CM | POA: Diagnosis not present

## 2021-07-10 DIAGNOSIS — N200 Calculus of kidney: Secondary | ICD-10-CM | POA: Diagnosis not present

## 2021-07-10 DIAGNOSIS — R3914 Feeling of incomplete bladder emptying: Secondary | ICD-10-CM | POA: Diagnosis not present

## 2021-08-11 DIAGNOSIS — M109 Gout, unspecified: Secondary | ICD-10-CM | POA: Diagnosis not present

## 2021-08-11 DIAGNOSIS — I129 Hypertensive chronic kidney disease with stage 1 through stage 4 chronic kidney disease, or unspecified chronic kidney disease: Secondary | ICD-10-CM | POA: Diagnosis not present

## 2021-08-11 DIAGNOSIS — N1831 Chronic kidney disease, stage 3a: Secondary | ICD-10-CM | POA: Diagnosis not present

## 2021-09-19 DIAGNOSIS — M109 Gout, unspecified: Secondary | ICD-10-CM | POA: Diagnosis not present

## 2021-10-06 DIAGNOSIS — N1330 Unspecified hydronephrosis: Secondary | ICD-10-CM | POA: Diagnosis not present

## 2021-10-06 DIAGNOSIS — N281 Cyst of kidney, acquired: Secondary | ICD-10-CM | POA: Diagnosis not present

## 2021-10-06 DIAGNOSIS — N200 Calculus of kidney: Secondary | ICD-10-CM | POA: Diagnosis not present

## 2021-10-13 DIAGNOSIS — N200 Calculus of kidney: Secondary | ICD-10-CM | POA: Diagnosis not present

## 2021-10-13 DIAGNOSIS — N401 Enlarged prostate with lower urinary tract symptoms: Secondary | ICD-10-CM | POA: Diagnosis not present

## 2021-10-13 DIAGNOSIS — R3914 Feeling of incomplete bladder emptying: Secondary | ICD-10-CM | POA: Diagnosis not present

## 2021-10-13 DIAGNOSIS — Q6239 Other obstructive defects of renal pelvis and ureter: Secondary | ICD-10-CM | POA: Diagnosis not present

## 2022-01-06 DIAGNOSIS — E6609 Other obesity due to excess calories: Secondary | ICD-10-CM | POA: Diagnosis not present

## 2022-01-06 DIAGNOSIS — Z1331 Encounter for screening for depression: Secondary | ICD-10-CM | POA: Diagnosis not present

## 2022-01-06 DIAGNOSIS — I129 Hypertensive chronic kidney disease with stage 1 through stage 4 chronic kidney disease, or unspecified chronic kidney disease: Secondary | ICD-10-CM | POA: Diagnosis not present

## 2022-01-06 DIAGNOSIS — R7309 Other abnormal glucose: Secondary | ICD-10-CM | POA: Diagnosis not present

## 2022-01-06 DIAGNOSIS — Z0001 Encounter for general adult medical examination with abnormal findings: Secondary | ICD-10-CM | POA: Diagnosis not present

## 2022-01-06 DIAGNOSIS — E7849 Other hyperlipidemia: Secondary | ICD-10-CM | POA: Diagnosis not present

## 2022-01-06 DIAGNOSIS — E782 Mixed hyperlipidemia: Secondary | ICD-10-CM | POA: Diagnosis not present

## 2022-01-06 DIAGNOSIS — Z6835 Body mass index (BMI) 35.0-35.9, adult: Secondary | ICD-10-CM | POA: Diagnosis not present

## 2022-01-06 DIAGNOSIS — I10 Essential (primary) hypertension: Secondary | ICD-10-CM | POA: Diagnosis not present

## 2022-01-26 DIAGNOSIS — M109 Gout, unspecified: Secondary | ICD-10-CM | POA: Diagnosis not present

## 2022-01-26 DIAGNOSIS — I129 Hypertensive chronic kidney disease with stage 1 through stage 4 chronic kidney disease, or unspecified chronic kidney disease: Secondary | ICD-10-CM | POA: Diagnosis not present

## 2022-01-26 DIAGNOSIS — N1831 Chronic kidney disease, stage 3a: Secondary | ICD-10-CM | POA: Diagnosis not present

## 2022-01-29 DIAGNOSIS — H524 Presbyopia: Secondary | ICD-10-CM | POA: Diagnosis not present

## 2022-01-29 DIAGNOSIS — H35033 Hypertensive retinopathy, bilateral: Secondary | ICD-10-CM | POA: Diagnosis not present

## 2022-05-07 DIAGNOSIS — Z23 Encounter for immunization: Secondary | ICD-10-CM | POA: Diagnosis not present

## 2022-08-24 DIAGNOSIS — I8392 Asymptomatic varicose veins of left lower extremity: Secondary | ICD-10-CM | POA: Diagnosis not present

## 2022-08-24 DIAGNOSIS — L570 Actinic keratosis: Secondary | ICD-10-CM | POA: Diagnosis not present

## 2022-08-24 DIAGNOSIS — D0461 Carcinoma in situ of skin of right upper limb, including shoulder: Secondary | ICD-10-CM | POA: Diagnosis not present

## 2022-08-24 DIAGNOSIS — D225 Melanocytic nevi of trunk: Secondary | ICD-10-CM | POA: Diagnosis not present

## 2022-08-24 DIAGNOSIS — L814 Other melanin hyperpigmentation: Secondary | ICD-10-CM | POA: Diagnosis not present

## 2022-08-24 DIAGNOSIS — D2371 Other benign neoplasm of skin of right lower limb, including hip: Secondary | ICD-10-CM | POA: Diagnosis not present

## 2022-08-24 DIAGNOSIS — L821 Other seborrheic keratosis: Secondary | ICD-10-CM | POA: Diagnosis not present

## 2022-09-08 ENCOUNTER — Ambulatory Visit: Payer: PPO | Attending: Cardiology | Admitting: Cardiology

## 2022-09-08 VITALS — BP 122/78 | HR 99 | Ht 66.0 in | Wt 220.6 lb

## 2022-09-08 DIAGNOSIS — I4729 Other ventricular tachycardia: Secondary | ICD-10-CM

## 2022-09-08 DIAGNOSIS — I10 Essential (primary) hypertension: Secondary | ICD-10-CM | POA: Diagnosis not present

## 2022-09-08 DIAGNOSIS — E782 Mixed hyperlipidemia: Secondary | ICD-10-CM

## 2022-09-08 NOTE — Progress Notes (Signed)
Clinical Summary Mr. Noah Cook is a 72 y.o.male seen today for follow up of the following medical problems.    1. NSVT - 13 beat run of NSVT noted during endscopy, self terminated. K was 4.4, Mg 2.2  CAD risk factors: mother with heart disease late 8s had CABG, father MI 52, HTN, HL  12/2019 echo LVEF 123456, grade I diastolic dysfunction. Aortic root mildly dilated 4.3 cm 01/2020 nuclear stress: apical infarct with mild ischemia. Overall low risk.    - no recent palpitatons     2. HTN - he is compliant with meds   3. Hyperlipidemia - compliant with statin - recent labs with pcp  12/2021 TC 118 TG 118 HDL 42 LDL 55     SH: coaches middle school football, coaches middle school baseball   Past Medical History:  Diagnosis Date   Gout    Hypercholesteremia    Hypertension    Urinary retention    foley since sept 2014     No Known Allergies   Current Outpatient Medications  Medication Sig Dispense Refill   allopurinol (ZYLOPRIM) 100 MG tablet Take 1 tablet by mouth 2 (two) times daily.     aspirin EC 81 MG tablet Take 81 mg by mouth daily.     colchicine 0.6 MG tablet Take 0.6 mg by mouth daily as needed (gout).      diltiazem (CARDIZEM CD) 240 MG 24 hr capsule Take 240 mg by mouth daily.     olmesartan (BENICAR) 40 MG tablet Take 40 mg by mouth daily.     Omega-3 Fatty Acids (FISH OIL) 1200 MG CAPS Take 1,200 mg by mouth daily.      simvastatin (ZOCOR) 40 MG tablet Take 40 mg by mouth daily with breakfast.      No current facility-administered medications for this visit.     Past Surgical History:  Procedure Laterality Date   CATARACT EXTRACTION W/PHACO Left 02/04/2018   Procedure: CATARACT EXTRACTION PHACO AND INTRAOCULAR LENS PLACEMENT (IOC);  Surgeon: Baruch Goldmann, MD;  Location: [facility];  Service: Ophthalmology;  Laterality: Left;  CDE: 2.98   CATARACT EXTRACTION W/PHACO Right 02/18/2018   Procedure: CATARACT EXTRACTION PHACO AND INTRAOCULAR LENS PLACEMENT  (IOC);  Surgeon: Baruch Goldmann, MD;  Location: [facility];  Service: Ophthalmology;  Laterality: Right;  CDE: 4.70   COLONOSCOPY N/A 11/17/2019   Procedure: COLONOSCOPY;  Surgeon: Danie Binder, MD;  [facility];  Service: Endoscopy;  Laterality: N/A;  9:30   GREEN LIGHT LASER TURP (TRANSURETHRAL RESECTION OF PROSTATE N/A 06/30/2013   Procedure: GREEN LIGHT LASER TURP (TRANSURETHRAL RESECTION OF PROSTATE WITH CYSTOGRAM;  Surgeon: Fredricka Bonine, MD;  Location: [facility];  Service: Urology;  Laterality: N/A;   NO PAST SURGERIES     POLYPECTOMY  11/17/2019   Procedure: POLYPECTOMY;  Surgeon: Danie Binder, MD;  Location: [facility];  Service: Endoscopy;;  cecum,transverse,splenic flexure   PROSTATE SURGERY  07/2019     No Known Allergies    Family History  Problem Relation Age of Onset   Colon cancer Other      Social History Mr. Noah Cook reports that he has never smoked. He has never used smokeless tobacco. Mr. Noah Cook reports no history of alcohol use.   Review of Systems CONSTITUTIONAL: No weight loss, fever, chills, weakness or fatigue.  HEENT: Eyes: No visual loss, blurred vision, double vision or yellow sclerae.No hearing loss, sneezing, congestion, runny nose or sore throat.  SKIN: No rash or itching.  CARDIOVASCULAR: per hpi RESPIRATORY: No shortness of breath, cough or sputum.  GASTROINTESTINAL: No anorexia, nausea, vomiting or diarrhea. No abdominal pain or blood.  GENITOURINARY: No burning on urination, no polyuria NEUROLOGICAL: No headache, dizziness, syncope, paralysis, ataxia, numbness or tingling in the extremities. No change in bowel or bladder control.  MUSCULOSKELETAL: No muscle, back pain, joint pain or stiffness.  LYMPHATICS: No enlarged nodes. No history of splenectomy.  PSYCHIATRIC: No history of depression or anxiety.  ENDOCRINOLOGIC: No reports of sweating, cold or heat intolerance. No polyuria or polydipsia.  Marland Kitchen   Physical  Examination Today's Vitals   09/08/22 1544 09/08/22 1600  BP: (!) 146/90 122/78  Pulse: 99   SpO2: 95%   Weight: 220 lb 9.6 oz (100.1 kg)   Height: 5' 6"$  (1.676 m)    Body mass index is 35.61 kg/m.  Gen: resting comfortably, no acute distress HEENT: no scleral icterus, pupils equal round and reactive, no palptable cervical adenopathy,  CV: RRR, no m/rg, no jvd Resp: Clear to auscultation bilaterally GI: abdomen is soft, non-tender, non-distended, normal bowel sounds, no hepatosplenomegaly MSK: extremities are warm, no edema.  Skin: warm, no rash Neuro:  no focal deficits Psych: appropriate affect   Diagnostic Studies  12/2019 echo IMPRESSIONS     1. Left ventricular ejection fraction, by estimation, is 60 to 65%. The  left ventricle has normal function. The left ventricle has no regional  wall motion abnormalities. There is mild left ventricular hypertrophy.  Left ventricular diastolic parameters  are consistent with Grade I diastolic dysfunction (impaired relaxation).   2. Right ventricular systolic function is normal. The right ventricular  size is normal. There is normal pulmonary artery systolic pressure.   3. The mitral valve is normal in structure. No evidence of mitral valve  regurgitation. No evidence of mitral stenosis.   4. The aortic valve is tricuspid. Aortic valve regurgitation is not  visualized. No aortic stenosis is present.   5. Aortic dilatation noted. There is mild dilatation of the aortic root  measuring 43 mm.    01/2020 nuclear stress There was no ST segment deviation noted during stress. This is a low risk study. Findings consistent with prior apical myocardial infarction with mild peri-infarct ischemia. The left ventricular ejection fraction is normal (55-65%).     Assessment and Plan   1. NSVT - incidentally noted during endoscopy, 13 beat run - overall benign workup for underlying heart disease with echo, low risk nuclear stress -no  symptoms - continue to monitor   2. HTN - bp is at goal based on manual recheck, continue current meds     3. Hyperlipidemia - lipids at goal, continue current meds  EKG today SR, RBBB  F/u 1 year  Arnoldo Lenis, M.D.

## 2022-09-08 NOTE — Patient Instructions (Signed)

## 2022-09-10 ENCOUNTER — Encounter: Payer: Self-pay | Admitting: *Deleted

## 2022-09-21 ENCOUNTER — Ambulatory Visit
Admission: EM | Admit: 2022-09-21 | Discharge: 2022-09-21 | Disposition: A | Discharge status: Home / Self Care | Payer: PPO | Attending: Nurse Practitioner | Admitting: Nurse Practitioner

## 2022-09-21 DIAGNOSIS — H6123 Impacted cerumen, bilateral: Secondary | ICD-10-CM | POA: Diagnosis not present

## 2022-09-21 MED ORDER — CARBAMIDE PEROXIDE 6.5 % OT SOLN: 5.0000 [drp] | Freq: Two times a day (BID) | OTIC | 0 refills | Status: AC

## 2022-09-21 NOTE — ED Triage Notes (Signed)
Pt reports right ear pain x 2 days

## 2022-09-21 NOTE — ED Provider Notes (Signed)
[facility]    CSN: [id number] Arrival date & time: 09/21/22  1332      History   Chief Complaint No chief complaint on file.   HPI Noah Cook is a 72 y.o. male.   Patient presents for right ear pain and clogged sensation for the past 2 days.  He endorses decreased and muffled hearing.  He was having ear pain on the outside of his ear, however this is resolved.  No fever, ear drainage, recent upper respiratory infection symptoms.  Does not use Q-tips on a regular basis.  Reports he does have a history of ceruminosis and has to have his ears flushed every so often and is usually very difficult for them to remove the earwax.  Has not tried anything for symptoms so far.     Past Medical History:  Diagnosis Date   Gout    Hypercholesteremia    Hypertension    Urinary retention    foley since sept 2014    Patient Active Problem List   Diagnosis Date Noted   Polyp of colon    Hydronephrosis 04/06/2013   Acute renal failure ([place]) 04/05/2013   BPH (benign prostatic hyperplasia) 04/05/2013   Anemia 04/05/2013   Hyperkalemia 04/05/2013   Gout    Hypercholesteremia     Past Surgical History:  Procedure Laterality Date   CATARACT EXTRACTION W/PHACO Left 02/04/2018   Procedure: CATARACT EXTRACTION PHACO AND INTRAOCULAR LENS PLACEMENT ([place]);  Surgeon: Baruch Goldmann, MD;  Location: [facility];  Service: Ophthalmology;  Laterality: Left;  CDE: 2.98   CATARACT EXTRACTION W/PHACO Right 02/18/2018   Procedure: CATARACT EXTRACTION PHACO AND INTRAOCULAR LENS PLACEMENT ([facility]);  Surgeon: Baruch Goldmann, MD;  Location: [facility];  Service: Ophthalmology;  Laterality: Right;  CDE: 4.70   COLONOSCOPY N/A 11/17/2019   Procedure: COLONOSCOPY;  Surgeon: Danie Binder, MD;  [facility]: [facility];  Service: Endoscopy;  Laterality: N/A;  9:30   GREEN LIGHT LASER TURP (TRANSURETHRAL RESECTION OF PROSTATE N/A 06/30/2013   Procedure: GREEN LIGHT LASER TURP (TRANSURETHRAL RESECTION OF  PROSTATE WITH CYSTOGRAM;  Surgeon: Fredricka Bonine, MD;  Location: [facility];  Service: Urology;  Laterality: N/A;   NO PAST SURGERIES     POLYPECTOMY  11/17/2019   Procedure: POLYPECTOMY;  Surgeon: Danie Binder, MD;  [facility];  Service: Endoscopy;;  cecum,transverse,splenic flexure   PROSTATE SURGERY  07/2019       Home Medications    Prior to Admission medications   Medication Sig Start Date End Date Taking? Authorizing Provider  carbamide peroxide (DEBROX) 6.5 % OTIC solution Place 5 drops into both ears 2 (two) times daily for 7 days. 09/21/22 09/28/22 Yes Eulogio Bear, NP  allopurinol (ZYLOPRIM) 100 MG tablet Take 1 tablet by mouth 2 (two) times daily. 05/20/21   [provider]  aspirin EC 81 MG tablet Take 81 mg by mouth daily.    [provider]  colchicine 0.6 MG tablet Take 0.6 mg by mouth daily as needed (gout).     [provider]  diltiazem (CARDIZEM CD) 240 MG 24 hr capsule Take 240 mg by mouth daily. 04/11/20   [provider]  olmesartan (BENICAR) 40 MG tablet Take 40 mg by mouth daily.    [provider]  Omega-3 Fatty Acids (FISH OIL) 1200 MG CAPS Take 1,200 mg by mouth daily.     [provider]  simvastatin (ZOCOR) 40 MG tablet Take 40 mg by mouth  daily with breakfast.     [provider]    Family History Family History  Problem Relation Age of Onset   Colon cancer Other     Social History Social History   Tobacco Use   Smoking status: Never   Smokeless tobacco: Never  Vaping Use   Vaping Use: Never used  Substance Use Topics   Alcohol use: No   Drug use: No     Allergies   Patient has no known allergies.   Review of Systems Review of Systems Per HPI  Physical Exam Triage Vital Signs ED Triage Vitals [09/21/22 1423]  Enc Vitals Group     BP (!) 155/90     Pulse Rate 64     Resp 18     Temp 98 F (36.7 C)     Temp Source Oral     SpO2 97 %      Weight      Height      Head Circumference      Peak Flow      Pain Score 5     Pain Loc      Pain Edu?      Excl. in [place]?    No data found.  Updated Vital Signs BP (!) 155/90 (BP Location: Right Arm)   Pulse 64   Temp 98 F (36.7 C) (Oral)   Resp 18   SpO2 97%   Visual Acuity Right Eye Distance:   Left Eye Distance:   Bilateral Distance:    Right Eye Near:   Left Eye Near:    Bilateral Near:     Physical Exam Vitals and nursing note reviewed.  Constitutional:      General: He is not in acute distress.    Appearance: Normal appearance. He is not toxic-appearing.  HENT:     Head: Normocephalic and atraumatic.     Right Ear: There is impacted cerumen.     Left Ear: There is impacted cerumen.     Mouth/Throat:     Mouth: Mucous membranes are moist.     Pharynx: Oropharynx is clear. No posterior oropharyngeal erythema.  Eyes:     General: No scleral icterus.    Extraocular Movements: Extraocular movements intact.  Pulmonary:     Effort: Pulmonary effort is normal. No respiratory distress.  Musculoskeletal:     Cervical back: Normal range of motion.  Lymphadenopathy:     Cervical: No cervical adenopathy.  Skin:    General: Skin is warm and dry.     Capillary Refill: Capillary refill takes less than 2 seconds.     Coloration: Skin is not jaundiced or pale.     Findings: No erythema.  Neurological:     Mental Status: He is alert and oriented to person, place, and time.  Psychiatric:        Behavior: Behavior is cooperative.      UC Treatments / Results  Labs (all labs ordered are listed, but only abnormal results are displayed) Labs Reviewed - No data to display  EKG   Radiology No results found.  Procedures Ear Cerumen Removal  Date/Time: 09/21/2022 5:04 PM  Performed by: Eulogio Bear, NP Authorized by: Eulogio Bear, NP   Consent:    Consent obtained:  Verbal   Consent given by:  Patient   Risks, benefits, and alternatives were  discussed: yes     Risks discussed:  Bleeding, infection, pain, TM perforation, dizziness and incomplete removal  Alternatives discussed:  Alternative treatment Universal protocol:    Procedure explained and questions answered to patient or proxy's satisfaction: yes     Patient identity confirmed:  Verbally with patient Procedure details:    Location:  R ear   Procedure type: curette     Procedure outcomes: cerumen removed   Post-procedure details:    Inspection:  Macerated skin and some cerumen remaining   Hearing quality:  Improved   Procedure completion:  Tolerated well, no immediate complications  (including critical care time)  Medications Ordered in UC Medications - No data to display  Initial Impression / Assessment and Plan / UC Course  I have reviewed the triage vital signs and the nursing notes.  Pertinent labs & imaging results that were available during my care of the patient were reviewed by me and considered in my medical decision making (see chart for details).   Patient is well-appearing, normotensive, afebrile, not tachycardic, not tachypneic, oxygenating well on room air.    1. Bilateral impacted cerumen Attempted ear lavage to bilateral ears; able to remove impacted cerumen bilaterally, however unable to fully remove after multiple attempts Unable to visualize tympanic membrane bilaterally post ear lavage; no bleeding noted to external auditory canal and hearing improved to the right ear per patient report Patient tolerated procedure well Recommended use of over-the-counter Debrox drops to help loosen earwax, then can return if muffled hearing persists  The patient was given the opportunity to ask questions.  All questions answered to their satisfaction.  The patient is in agreement to this plan.    Final Clinical Impressions(s) / UC Diagnoses   Final diagnoses:  Bilateral impacted cerumen     Discharge Instructions      Start using Debrox drops to help  loosen the ear wax in your ears.  If the muffled hearing persists after using the drops for about 1 week, come back in to have your ears rinsed again.      ED Prescriptions     Medication Sig Dispense Auth. Provider   carbamide peroxide (DEBROX) 6.5 % OTIC solution Place 5 drops into both ears 2 (two) times daily for 7 days. 15 mL Eulogio Bear, NP      PDMP not reviewed this encounter.   Eulogio Bear, NP 09/21/22 1705

## 2022-09-21 NOTE — Discharge Instructions (Signed)
Start using Debrox drops to help loosen the ear wax in your ears.  If the muffled hearing persists after using the drops for about 1 week, come back in to have your ears rinsed again.

## 2022-10-21 DIAGNOSIS — R3914 Feeling of incomplete bladder emptying: Secondary | ICD-10-CM | POA: Diagnosis not present

## 2022-10-21 DIAGNOSIS — N200 Calculus of kidney: Secondary | ICD-10-CM | POA: Diagnosis not present

## 2022-10-21 DIAGNOSIS — Q6211 Congenital occlusion of ureteropelvic junction: Secondary | ICD-10-CM | POA: Diagnosis not present

## 2022-10-21 DIAGNOSIS — N401 Enlarged prostate with lower urinary tract symptoms: Secondary | ICD-10-CM | POA: Diagnosis not present

## 2022-10-28 ENCOUNTER — Encounter: Payer: Self-pay | Admitting: *Deleted

## 2022-12-16 ENCOUNTER — Telehealth: Payer: Self-pay | Admitting: *Deleted

## 2022-12-16 NOTE — Telephone Encounter (Signed)
  Procedure: colonoscopy  Height: 5'6" Weight: 214 lbs      BMI: 34.5  Have you had a colonoscopy before?  Yes, 11/17/19, Dr.Fields  Do you have family history of colon cancer?  Yes, grandfater  Do you have a family history of polyps? yes  Previous colonoscopy with polyps removed? yes  Do you have a history colorectal cancer?   no  Are you diabetic?  No  Do you have a prosthetic or mechanical heart valve? No  Do you have a pacemaker/defibrillator?   No  Have you had endocarditis/atrial fibrillation?  No  Do you use supplemental oxygen/CPAP?  No  Have you had joint replacement within the last 12 months?  No  Do you tend to be constipated or have to use laxatives?  No   Do you have history of alcohol use? If yes, how much and how often.  No  Do you have history or are you using drugs? If yes, what do are you  using?  No  Have you ever had a stroke/heart attack?  No  Have you ever had a heart or other vascular stent placed,?No  Do you take weight loss medication? No  Do you take any blood-thinning medications such as: (Plavix, aspirin, Coumadin, Aggrenox, Brilinta, Xarelto, Eliquis, Pradaxa, Savaysa or Effient)? No  If yes we need the name, milligram, dosage and who is prescribing doctor:  N/A             Current Outpatient Medications  Medication Sig Dispense Refill   allopurinol (ZYLOPRIM) 100 MG tablet Take 1 tablet by mouth 2 (two) times daily.     aspirin EC 81 MG tablet Take 81 mg by mouth daily.     colchicine 0.6 MG tablet Take 0.6 mg by mouth daily as needed (gout).      diltiazem (CARDIZEM CD) 240 MG 24 hr capsule Take 240 mg by mouth daily.     olmesartan (BENICAR) 40 MG tablet Take 40 mg by mouth daily.     Omega-3 Fatty Acids (FISH OIL) 1200 MG CAPS Take 1,200 mg by mouth daily.      simvastatin (ZOCOR) 40 MG tablet Take 40 mg by mouth daily with breakfast.      No current facility-administered medications for this visit.    No Known Allergies

## 2022-12-24 DIAGNOSIS — E782 Mixed hyperlipidemia: Secondary | ICD-10-CM | POA: Diagnosis not present

## 2022-12-24 DIAGNOSIS — Z6834 Body mass index (BMI) 34.0-34.9, adult: Secondary | ICD-10-CM | POA: Diagnosis not present

## 2022-12-24 DIAGNOSIS — R7309 Other abnormal glucose: Secondary | ICD-10-CM | POA: Diagnosis not present

## 2022-12-24 DIAGNOSIS — E7849 Other hyperlipidemia: Secondary | ICD-10-CM | POA: Diagnosis not present

## 2022-12-24 DIAGNOSIS — E6609 Other obesity due to excess calories: Secondary | ICD-10-CM | POA: Diagnosis not present

## 2022-12-24 DIAGNOSIS — I10 Essential (primary) hypertension: Secondary | ICD-10-CM | POA: Diagnosis not present

## 2022-12-24 DIAGNOSIS — Z1331 Encounter for screening for depression: Secondary | ICD-10-CM | POA: Diagnosis not present

## 2022-12-24 DIAGNOSIS — N401 Enlarged prostate with lower urinary tract symptoms: Secondary | ICD-10-CM | POA: Diagnosis not present

## 2022-12-24 DIAGNOSIS — Z0001 Encounter for general adult medical examination with abnormal findings: Secondary | ICD-10-CM | POA: Diagnosis not present

## 2023-01-11 ENCOUNTER — Encounter: Payer: Self-pay | Admitting: Internal Medicine

## 2023-01-11 NOTE — Telephone Encounter (Signed)
Please schedule OV per Kristen. ?

## 2023-01-11 NOTE — Telephone Encounter (Signed)
ASA 3.   12/2019 echo LVEF 60-65%, grade I diastolic dysfunction.  01/2020 nuclear stress: apical infarct with mild ischemia.  Though he was doing well at last visit with cardiology, we will have to see him in the office prior to scheduling.

## 2023-01-25 DIAGNOSIS — M109 Gout, unspecified: Secondary | ICD-10-CM | POA: Diagnosis not present

## 2023-01-25 DIAGNOSIS — I129 Hypertensive chronic kidney disease with stage 1 through stage 4 chronic kidney disease, or unspecified chronic kidney disease: Secondary | ICD-10-CM | POA: Diagnosis not present

## 2023-01-25 DIAGNOSIS — N1831 Chronic kidney disease, stage 3a: Secondary | ICD-10-CM | POA: Diagnosis not present

## 2023-01-30 NOTE — Progress Notes (Signed)
Primary Care Physician:  Assunta Found, MD Primary Gastroenterologist:  Dr. Marletta Lor  Chief Complaint  Patient presents with   Colonoscopy    Colonoscopy screening     HPI:   Noah Cook is a 72 y.o. male presenting today to discuss scheduling surveillance colonoscopy.   Last Colonoscopy 11/17/19: Five 2-7 mm polyps resected and retrieved, external and internal hemorrhoids, tortuous colon. Pathology with 2 tubular adenomas and 3 hyperplastic polyps. Recommended 3 year surveillance.   Patient reports he is doing well overall.  No GI concerns.  Denies abdominal pain, BRBPR, melena, constipation, diarrhea, nausea, vomiting, reflux symptoms, or dysphagia.  He has been intentionally trying to lose weight as his hemoglobin A1c was mildly elevated recently.  States he was 222 pounds, now 208 pounds today.  States he quit eating ice cream and bread.   12/2019 echo LVEF 60-65%, grade I diastolic dysfunction.  01/2020 nuclear stress: apical infarct with mild ischemia.  Past Medical History:  Diagnosis Date   CKD (chronic kidney disease)    Gout    Hypercholesteremia    Hypertension    Urinary retention    foley since sept 2014    Past Surgical History:  Procedure Laterality Date   CATARACT EXTRACTION W/PHACO Left 02/04/2018   Procedure: CATARACT EXTRACTION PHACO AND INTRAOCULAR LENS PLACEMENT ([facility]);  Surgeon: Fabio Pierce, MD;  Location: [facility];  Service: Ophthalmology;  Laterality: Left;  CDE: 2.98   CATARACT EXTRACTION W/PHACO Right 02/18/2018   Procedure: CATARACT EXTRACTION PHACO AND INTRAOCULAR LENS PLACEMENT ([facility]);  Surgeon: Fabio Pierce, MD;  Location: [facility];  Service: Ophthalmology;  Laterality: Right;  CDE: 4.70   COLONOSCOPY N/A 11/17/2019   Procedure: COLONOSCOPY;  Surgeon: West Bali, MD;  [facility];  Service: Endoscopy;  Laterality: N/A;  9:30   GREEN LIGHT LASER TURP (TRANSURETHRAL RESECTION OF PROSTATE N/A 06/30/2013   Procedure: GREEN LIGHT LASER  TURP (TRANSURETHRAL RESECTION OF PROSTATE WITH CYSTOGRAM;  Surgeon: Antony Haste, MD;  Location: [facility];  Service: Urology;  Laterality: N/A;   NO PAST SURGERIES     POLYPECTOMY  11/17/2019   Procedure: POLYPECTOMY;  Surgeon: [place] Bali, MD;  [facility];  Service: Endoscopy;;  cecum,transverse,splenic flexure   PROSTATE SURGERY  07/2019    Current Outpatient Medications  Medication Sig Dispense Refill   allopurinol (ZYLOPRIM) 300 MG tablet Take 300 mg by mouth daily.     diltiazem (CARDIZEM CD) 240 MG 24 hr capsule Take 240 mg by mouth daily.     olmesartan (BENICAR) 40 MG tablet Take 40 mg by mouth daily.     simvastatin (ZOCOR) 40 MG tablet Take 40 mg by mouth daily with breakfast.      No current facility-administered medications for this visit.    Allergies as of 02/03/2023   (No Known Allergies)    Family History  Problem Relation Age of Onset   Colon cancer Maternal Grandfather     Social History   Socioeconomic History   Marital status: Married    Spouse name: Not on file   Number of children: Not on file   Years of education: Not on file   Highest education level: Not on file  Occupational History   Not on file  Tobacco Use   Smoking status: Never   Smokeless tobacco: Never  Vaping Use   Vaping status: Never Used  Substance and Sexual Activity   Alcohol use: No   Drug use: No  Sexual activity: Never  Other Topics Concern   Not on file  Social History Narrative   Not on file   Social Determinants of Health   Financial Resource Strain: Not on file  Food Insecurity: Not on file  Transportation Needs: Not on file  Physical Activity: Not on file  Stress: Not on file  Social Connections: Not on file  Intimate Partner Violence: Not on file    Review of Systems: Gen: Denies any fever, chills, cold or flulike symptoms, presyncope, syncope. CV: Denies chest pain, heart palpitations. Resp: Denies shortness of breath at rest,  cough. GI: See PI GU : Denies urinary burning, urinary frequency, urinary hesitancy MS: Denies joint pain. Derm: Denies rash. Psych: Denies depression, anxiety. Heme: See HPI  Physical Exam: BP 101/62 (BP Location: Left Arm, Patient Position: Sitting, Cuff Size: Normal)   Pulse 70   Temp 97.6 F (36.4 C) (Temporal)   Ht 5\' 6"  (1.676 m)   Wt 208 lb 3.2 oz (94.4 kg)   SpO2 97%   BMI 33.60 kg/m  General:   Alert and oriented. Pleasant and cooperative. Well-nourished and well-developed.  Head:  Normocephalic and atraumatic. Eyes:  Without icterus, sclera clear and conjunctiva pink.  Ears:  Normal auditory acuity. Lungs:  Clear to auscultation bilaterally. No wheezes, rales, or rhonchi. No distress.  Heart:  S1, S2 present without murmurs appreciated.  Abdomen:  +BS, soft, non-tender and non-distended. No HSM noted. No guarding or rebound. No masses appreciated.  Rectal:  Deferred  Msk:  Symmetrical without gross deformities. Normal posture. Extremities:  Without edema. Neurologic:  Alert and  oriented x4;  grossly normal neurologically. Skin:  Intact without significant lesions or rashes. Psych:  Normal mood and affect.    Assessment:  72 year old male with history of HTN, HLD, gout, grade 1 diastolic dysfunction, CKD, adenomatous colon polyps, presenting today to discuss scheduling surveillance colonoscopy.  Last colonoscopy was in April 2021 with 2 tubular adenomas and 3 hyperplastic polyps removed.  Recommended 3-year surveillance.  Currently without any significant GI symptoms.  No alarm symptoms.  Maternal grandfather with history of colon cancer.   Plan:  Proceed with colonoscopy with propofol by Dr. Marletta Lor in near future. The risks, benefits, and alternatives have been discussed with the patient in detail. The patient states understanding and desires to proceed.  ASA 3 Follow-up as needed.    Ermalinda Memos, PA-C [facility] 02/03/2023

## 2023-02-01 DIAGNOSIS — H524 Presbyopia: Secondary | ICD-10-CM | POA: Diagnosis not present

## 2023-02-01 DIAGNOSIS — H35373 Puckering of macula, bilateral: Secondary | ICD-10-CM | POA: Diagnosis not present

## 2023-02-03 ENCOUNTER — Encounter: Payer: Self-pay | Admitting: Gastroenterology

## 2023-02-03 ENCOUNTER — Ambulatory Visit: Payer: PPO | Admitting: Gastroenterology

## 2023-02-03 VITALS — BP 101/62 | HR 70 | Temp 97.6°F | Ht 66.0 in | Wt 208.2 lb

## 2023-02-03 DIAGNOSIS — Z86010 Personal history of colonic polyps: Secondary | ICD-10-CM

## 2023-02-03 NOTE — Patient Instructions (Addendum)
We will arrange for you to have a colonoscopy in the near future with Dr. Marletta Lor.  We will see you back as needed.  Do not hesitate to call if you develop any new GI problems.  It was nice to meet you today!  Ermalinda Memos, PA-C [facility]

## 2023-02-05 DIAGNOSIS — N1831 Chronic kidney disease, stage 3a: Secondary | ICD-10-CM | POA: Diagnosis not present

## 2023-02-08 ENCOUNTER — Encounter: Payer: Self-pay | Admitting: *Deleted

## 2023-02-08 ENCOUNTER — Telehealth: Payer: Self-pay | Admitting: *Deleted

## 2023-02-08 MED ORDER — PEG 3350-KCL-NA BICARB-NACL 420 G PO SOLR: 4000.0000 mL | Freq: Once | ORAL | 0 refills | Status: AC

## 2023-02-08 NOTE — Telephone Encounter (Signed)
Spoke with pt. Scheduled for TCS with Dr. Marletta Lor ASA 3 8/19. Aware will send instructions and will call back with pre-op appt. Rx for prep to be sent to pharmacy.

## 2023-02-11 DIAGNOSIS — Z6833 Body mass index (BMI) 33.0-33.9, adult: Secondary | ICD-10-CM | POA: Diagnosis not present

## 2023-02-11 DIAGNOSIS — E6609 Other obesity due to excess calories: Secondary | ICD-10-CM | POA: Diagnosis not present

## 2023-02-11 DIAGNOSIS — I952 Hypotension due to drugs: Secondary | ICD-10-CM | POA: Diagnosis not present

## 2023-02-11 DIAGNOSIS — R031 Nonspecific low blood-pressure reading: Secondary | ICD-10-CM | POA: Diagnosis not present

## 2023-02-24 ENCOUNTER — Encounter: Payer: Self-pay | Admitting: *Deleted

## 2023-02-24 NOTE — Telephone Encounter (Signed)
Pt stopped by the office and said he was told before not to use prep due to his kidney issues. He said he was told to do the Miralax prep by provider that was previously here. Instructions for Miralax prep printed for pt.

## 2023-03-04 ENCOUNTER — Encounter (HOSPITAL_COMMUNITY)
Admission: RE | Admit: 2023-03-04 | Discharge: 2023-03-04 | Disposition: A | Discharge status: Home / Self Care | Payer: PPO | Source: Ambulatory Visit | Attending: Internal Medicine | Admitting: Internal Medicine

## 2023-03-08 ENCOUNTER — Ambulatory Visit (HOSPITAL_COMMUNITY)
Admission: RE | Admit: 2023-03-08 | Discharge: 2023-03-08 | Disposition: A | Discharge status: Home / Self Care | Payer: PPO | Attending: Internal Medicine | Admitting: Internal Medicine

## 2023-03-08 ENCOUNTER — Ambulatory Visit (HOSPITAL_COMMUNITY): Payer: PPO | Admitting: Certified Registered"

## 2023-03-08 ENCOUNTER — Encounter (HOSPITAL_COMMUNITY)
Admission: RE | Disposition: A | Discharge status: Home / Self Care | Payer: Self-pay | Source: Home / Self Care | Attending: Internal Medicine

## 2023-03-08 ENCOUNTER — Ambulatory Visit (HOSPITAL_BASED_OUTPATIENT_CLINIC_OR_DEPARTMENT_OTHER): Payer: PPO | Admitting: Certified Registered"

## 2023-03-08 ENCOUNTER — Encounter (HOSPITAL_COMMUNITY): Payer: Self-pay

## 2023-03-08 DIAGNOSIS — N189 Chronic kidney disease, unspecified: Secondary | ICD-10-CM | POA: Insufficient documentation

## 2023-03-08 DIAGNOSIS — D124 Benign neoplasm of descending colon: Secondary | ICD-10-CM

## 2023-03-08 DIAGNOSIS — K635 Polyp of colon: Secondary | ICD-10-CM | POA: Diagnosis not present

## 2023-03-08 DIAGNOSIS — Z86010 Personal history of colonic polyps: Secondary | ICD-10-CM | POA: Insufficient documentation

## 2023-03-08 DIAGNOSIS — K648 Other hemorrhoids: Secondary | ICD-10-CM | POA: Diagnosis not present

## 2023-03-08 DIAGNOSIS — Z09 Encounter for follow-up examination after completed treatment for conditions other than malignant neoplasm: Secondary | ICD-10-CM | POA: Diagnosis not present

## 2023-03-08 DIAGNOSIS — Z1211 Encounter for screening for malignant neoplasm of colon: Secondary | ICD-10-CM | POA: Diagnosis not present

## 2023-03-08 DIAGNOSIS — I129 Hypertensive chronic kidney disease with stage 1 through stage 4 chronic kidney disease, or unspecified chronic kidney disease: Secondary | ICD-10-CM | POA: Diagnosis not present

## 2023-03-08 DIAGNOSIS — K649 Unspecified hemorrhoids: Secondary | ICD-10-CM

## 2023-03-08 DIAGNOSIS — D649 Anemia, unspecified: Secondary | ICD-10-CM | POA: Diagnosis not present

## 2023-03-08 HISTORY — PX: POLYPECTOMY: SHX149

## 2023-03-08 HISTORY — PX: COLONOSCOPY WITH PROPOFOL: SHX5780

## 2023-03-08 SURGERY — COLONOSCOPY WITH PROPOFOL
Anesthesia: General

## 2023-03-08 MED ORDER — LIDOCAINE HCL (CARDIAC) PF 100 MG/5ML IV SOSY
PREFILLED_SYRINGE | INTRAVENOUS | Status: DC | PRN
  Administered 2023-03-08: 50 mg via INTRAVENOUS

## 2023-03-08 MED ORDER — LACTATED RINGERS IV SOLN: INTRAVENOUS | Status: DC | PRN

## 2023-03-08 MED ORDER — PROPOFOL 10 MG/ML IV BOLUS
INTRAVENOUS | Status: DC | PRN
  Administered 2023-03-08: 100 mg via INTRAVENOUS
  Administered 2023-03-08: 30 mg via INTRAVENOUS
  Administered 2023-03-08: 50 mg via INTRAVENOUS
  Administered 2023-03-08: 30 mg via INTRAVENOUS
  Administered 2023-03-08: 50 mg via INTRAVENOUS

## 2023-03-08 NOTE — Transfer of Care (Signed)
Immediate Anesthesia Transfer of Care Note  Patient: Noah Cook  Procedure(s) Performed: COLONOSCOPY WITH PROPOFOL POLYPECTOMY INTESTINAL  Patient Location: Short Stay  Anesthesia Type:General  Level of Consciousness: drowsy  Airway & Oxygen Therapy: Patient Spontanous Breathing  Post-op Assessment: Report given to RN and Post -op Vital signs reviewed and stable  Post vital signs: Reviewed and stable  Last Vitals:  Vitals Value Taken Time  BP    Temp    Pulse    Resp    SpO2      Last Pain:  Vitals:   03/08/23 0849  TempSrc:   PainSc: 0-No pain         Complications: No notable events documented.

## 2023-03-08 NOTE — Anesthesia Procedure Notes (Signed)
Date/Time: 03/08/2023 8:53 AM  Performed by: Julian Reil, CRNAPre-anesthesia Checklist: Patient identified, Emergency Drugs available, Suction available and Patient being monitored Patient Re-evaluated:Patient Re-evaluated prior to induction Oxygen Delivery Method: Nasal cannula Induction Type: IV induction Placement Confirmation: positive ETCO2

## 2023-03-08 NOTE — Discharge Instructions (Signed)

## 2023-03-08 NOTE — Anesthesia Preprocedure Evaluation (Signed)
Anesthesia Evaluation  Patient identified by MRN, date of birth, ID band Patient awake    Reviewed: Allergy & Precautions, H&P , NPO status , Patient's Chart, lab work & pertinent test results, reviewed documented beta blocker date and time   Airway Mallampati: II  TM Distance: >3 FB Neck ROM: full    Dental no notable dental hx.    Pulmonary neg pulmonary ROS   Pulmonary exam normal breath sounds clear to auscultation       Cardiovascular Exercise Tolerance: Good hypertension, negative cardio ROS  Rhythm:regular Rate:Normal     Neuro/Psych negative neurological ROS  negative psych ROS   GI/Hepatic negative GI ROS, Neg liver ROS,,,  Endo/Other  negative endocrine ROS    Renal/GU Renal diseasenegative Renal ROS  negative genitourinary   Musculoskeletal   Abdominal   Peds  Hematology negative hematology ROS (+) Blood dyscrasia, anemia   Anesthesia Other Findings   Reproductive/Obstetrics negative OB ROS                             Anesthesia Physical Anesthesia Plan  ASA: 2  Anesthesia Plan: General   Post-op Pain Management:    Induction:   PONV Risk Score and Plan: Propofol infusion  Airway Management Planned:   Additional Equipment:   Intra-op Plan:   Post-operative Plan:   Informed Consent: I have reviewed the patients History and Physical, chart, labs and discussed the procedure including the risks, benefits and alternatives for the proposed anesthesia with the patient or authorized representative who has indicated his/her understanding and acceptance.     Dental Advisory Given  Plan Discussed with: CRNA  Anesthesia Plan Comments:        Anesthesia Quick Evaluation

## 2023-03-08 NOTE — Op Note (Signed)
[facility] Patient Name: Noah Cook Procedure Date: 03/08/2023 8:41 AM MRN: [id number] Date of Birth: [birth year] Attending MD: Hennie Duos. Marletta Lor , [place], [id number] CSN: [id number] Age: 72 Admit Type: Outpatient Procedure:                Colonoscopy Indications:              Surveillance: Personal history of adenomatous                            polyps on last colonoscopy 3 years ago Providers:                Hennie Duos. Marletta Lor, DO, Crystal Page, Durwin Glaze Tech, Technician Referring MD:              Medicines:                See the Anesthesia note for documentation of the                            administered medications Complications:            No immediate complications. Estimated Blood Loss:     Estimated blood loss was minimal. Procedure:                Pre-Anesthesia Assessment:                           - The anesthesia plan was to use monitored                            anesthesia care (MAC).                           After obtaining informed consent, the colonoscope                            was passed under direct vision. Throughout the                            procedure, the patient's blood pressure, pulse, and                            oxygen saturations were monitored continuously. The                            [id number]-HQ190L ([id number]) scope was introduced through                            the anus and advanced to the the cecum, identified                            by appendiceal orifice and ileocecal valve. The                            colonoscopy was technically difficult  and complex                            due to a redundant colon and significant looping.                            Successful completion of the procedure was aided by                            applying abdominal pressure. The patient tolerated                            the procedure well. The quality of the bowel                            preparation  was evaluated using the BBPS [facility]                            Bowel Preparation Scale) with scores of: Right                            Colon = 3, Transverse Colon = 3 and Left Colon = 3                            (entire mucosa seen well with no residual staining,                            small fragments of stool or opaque liquid). The                            total BBPS score equals 9. Scope In: 8:52:53 AM Scope Out: 9:09:12 AM Scope Withdrawal Time: 0 hours 14 minutes 17 seconds  Total Procedure Duration: 0 hours 16 minutes 19 seconds  Findings:      Non-bleeding internal hemorrhoids were found during endoscopy.      A 5 mm polyp was found in the descending colon. The polyp was sessile.       The polyp was removed with a cold snare. Resection and retrieval were       complete.      The exam was otherwise without abnormality. Impression:               - Non-bleeding internal hemorrhoids.                           - One 5 mm polyp in the descending colon, removed                            with a cold snare. Resected and retrieved.                           - The examination was otherwise normal. Moderate Sedation:      Per Anesthesia Care Recommendation:           - Patient has a contact number available for  emergencies. The signs and symptoms of potential                            delayed complications were discussed with the                            patient. Return to normal activities tomorrow.                            Written discharge instructions were provided to the                            patient.                           - Resume previous diet.                           - Continue present medications.                           - Await pathology results.                           - Repeat colonoscopy in 5 years for surveillance.                           - Return to GI clinic PRN. Procedure Code(s):        --- Professional ---                            [phone], Colonoscopy, flexible; with removal of                            tumor(s), polyp(s), or other lesion(s) by snare                            technique Diagnosis Code(s):        --- Professional ---                           Z86.010, Personal history of colonic polyps                           D12.4, Benign neoplasm of descending colon                           K64.8, Other hemorrhoids CPT copyright 2022 American [employer] Association. All rights reserved. The codes documented in this report are preliminary and upon coder review may  be revised to meet current compliance requirements. Hennie Duos. Marletta Lor, DO Hennie Duos. Marletta Lor, DO 03/08/2023 9:12:10 AM This report has been signed electronically. Number of Addenda: 0

## 2023-03-08 NOTE — H&P (Signed)
Primary Care Physician:  Assunta Found, MD Primary Gastroenterologist:  Dr. Marletta Lor  Pre-Procedure History & Physical: HPI:  Noah Cook is a 72 y.o. male is here for a colonoscopy to be performed for surveillance purposes, personal history of adenomatous colon polyps in 2021  Past Medical History:  Diagnosis Date   CKD (chronic kidney disease)    Gout    Hypercholesteremia    Hypertension    Urinary retention    foley since sept 2014    Past Surgical History:  Procedure Laterality Date   CATARACT EXTRACTION W/PHACO Left 02/04/2018   Procedure: CATARACT EXTRACTION PHACO AND INTRAOCULAR LENS PLACEMENT (IOC);  Surgeon: Fabio Pierce, MD;  Location: [facility];  Service: Ophthalmology;  Laterality: Left;  CDE: 2.98   CATARACT EXTRACTION W/PHACO Right 02/18/2018   Procedure: CATARACT EXTRACTION PHACO AND INTRAOCULAR LENS PLACEMENT (IOC);  Surgeon: Fabio Pierce, MD;  Location: [facility];  Service: Ophthalmology;  Laterality: Right;  CDE: 4.70   COLONOSCOPY N/A 11/17/2019   Procedure: COLONOSCOPY;  Surgeon: West Bali, MD;  Location: [facility];  Service: Endoscopy;  Laterality: N/A;  9:30   GREEN LIGHT LASER TURP (TRANSURETHRAL RESECTION OF PROSTATE N/A 06/30/2013   Procedure: GREEN LIGHT LASER TURP (TRANSURETHRAL RESECTION OF PROSTATE WITH CYSTOGRAM;  Surgeon: Antony Haste, MD;  Location: [facility];  Service: Urology;  Laterality: N/A;   NO PAST SURGERIES     POLYPECTOMY  11/17/2019   Procedure: POLYPECTOMY;  Surgeon: West Bali, MD;  Location: [facility];  Service: Endoscopy;;  cecum,transverse,splenic flexure   PROSTATE SURGERY  07/2019    Prior to Admission medications   Medication Sig Start Date End Date Taking? Authorizing Provider  allopurinol (ZYLOPRIM) 300 MG tablet Take 300 mg by mouth daily.   Yes [provider]  diltiazem (CARDIZEM CD) 240 MG 24 hr capsule Take 240 mg by mouth daily. 04/11/20  Yes [provider]  olmesartan (BENICAR)  40 MG tablet Take 40 mg by mouth daily.   Yes [provider]  simvastatin (ZOCOR) 40 MG tablet Take 40 mg by mouth daily with breakfast.    Yes [provider]    Allergies as of 02/08/2023   (No Known Allergies)    Family History  Problem Relation Age of Onset   Colon cancer Maternal Grandfather     Social History   Socioeconomic History   Marital status: Married    Spouse name: Not on file   Number of children: Not on file   Years of education: Not on file   Highest education level: Not on file  Occupational History   Not on file  Tobacco Use   Smoking status: Never   Smokeless tobacco: Never  Vaping Use   Vaping status: Never Used  Substance and Sexual Activity   Alcohol use: No   Drug use: No   Sexual activity: Never  Other Topics Concern   Not on file  Social History Narrative   Not on file   Social Determinants of Health   Financial Resource Strain: Not on file  Food Insecurity: Not on file  Transportation Needs: Not on file  Physical Activity: Not on file  Stress: Not on file  Social Connections: Not on file  Intimate Partner Violence: Not on file    Review of Systems: See HPI, otherwise negative ROS  Physical Exam: Vital signs in last 24 hours: Temp:  [97.8 F (36.6 C)] 97.8 F (36.6 C) (08/19 0753) Pulse Rate:  [76]  76 (08/19 0753) Resp:  [12] 12 (08/19 0753) BP: (154)/(82) 154/82 (08/19 0753) SpO2:  [98 %] 98 % (08/19 0753) Weight:  [90.7 kg] 90.7 kg (08/19 0753)   General:   Alert,  Well-developed, well-nourished, pleasant and cooperative in NAD Head:  Normocephalic and atraumatic. Eyes:  Sclera clear, no icterus.   Conjunctiva pink. Ears:  Normal auditory acuity. Nose:  No deformity, discharge,  or lesions. Msk:  Symmetrical without gross deformities. Normal posture. Extremities:  Without clubbing or edema. Neurologic:  Alert and  oriented x4;  grossly normal neurologically. Skin:  Intact without significant lesions  or rashes. Psych:  Alert and cooperative. Normal mood and affect.  Impression/Plan: [name] is here for a colonoscopy to be performed for surveillance purposes, personal history of adenomatous colon polyps in 2021  The risks of the procedure including infection, bleed, or perforation as well as benefits, limitations, alternatives and imponderables have been reviewed with the patient. Questions have been answered. All parties agreeable.

## 2023-03-09 LAB — SURGICAL PATHOLOGY

## 2023-03-09 NOTE — Anesthesia Postprocedure Evaluation (Signed)
Anesthesia Post Note  Patient: Noah Cook  Procedure(s) Performed: COLONOSCOPY WITH PROPOFOL POLYPECTOMY INTESTINAL  Patient location during evaluation: Phase II Anesthesia Type: General Level of consciousness: awake Pain management: pain level controlled Vital Signs Assessment: post-procedure vital signs reviewed and stable Respiratory status: spontaneous breathing and respiratory function stable Cardiovascular status: blood pressure returned to baseline and stable Postop Assessment: no headache and no apparent nausea or vomiting Anesthetic complications: no Comments: Late entry   No notable events documented.   Last Vitals:  Vitals:   03/08/23 0913 03/08/23 0918  BP: 97/63 103/68  Pulse: 64   Resp: 20   Temp: (!) 36.3 C   SpO2: 94%     Last Pain:  Vitals:   03/08/23 0913  TempSrc: Axillary  PainSc: 0-No pain                 Windell [place]

## 2023-03-11 ENCOUNTER — Encounter (HOSPITAL_COMMUNITY): Payer: Self-pay | Admitting: Internal Medicine

## 2023-04-13 DIAGNOSIS — Z6832 Body mass index (BMI) 32.0-32.9, adult: Secondary | ICD-10-CM | POA: Diagnosis not present

## 2023-04-13 DIAGNOSIS — S335XXA Sprain of ligaments of lumbar spine, initial encounter: Secondary | ICD-10-CM | POA: Diagnosis not present

## 2023-04-13 DIAGNOSIS — E6609 Other obesity due to excess calories: Secondary | ICD-10-CM | POA: Diagnosis not present

## 2023-05-24 DIAGNOSIS — Z23 Encounter for immunization: Secondary | ICD-10-CM | POA: Diagnosis not present

## 2023-08-04 DIAGNOSIS — H35373 Puckering of macula, bilateral: Secondary | ICD-10-CM | POA: Diagnosis not present

## 2023-08-26 DIAGNOSIS — L570 Actinic keratosis: Secondary | ICD-10-CM | POA: Diagnosis not present

## 2023-08-26 DIAGNOSIS — L905 Scar conditions and fibrosis of skin: Secondary | ICD-10-CM | POA: Diagnosis not present

## 2023-08-26 DIAGNOSIS — D2271 Melanocytic nevi of right lower limb, including hip: Secondary | ICD-10-CM | POA: Diagnosis not present

## 2023-08-26 DIAGNOSIS — Z85828 Personal history of other malignant neoplasm of skin: Secondary | ICD-10-CM | POA: Diagnosis not present

## 2023-08-26 DIAGNOSIS — I8391 Asymptomatic varicose veins of right lower extremity: Secondary | ICD-10-CM | POA: Diagnosis not present

## 2023-08-26 DIAGNOSIS — D225 Melanocytic nevi of trunk: Secondary | ICD-10-CM | POA: Diagnosis not present

## 2023-08-26 DIAGNOSIS — D2371 Other benign neoplasm of skin of right lower limb, including hip: Secondary | ICD-10-CM | POA: Diagnosis not present

## 2023-08-26 DIAGNOSIS — L821 Other seborrheic keratosis: Secondary | ICD-10-CM | POA: Diagnosis not present

## 2023-08-26 DIAGNOSIS — L814 Other melanin hyperpigmentation: Secondary | ICD-10-CM | POA: Diagnosis not present

## 2023-08-26 DIAGNOSIS — D485 Neoplasm of uncertain behavior of skin: Secondary | ICD-10-CM | POA: Diagnosis not present

## 2023-08-26 DIAGNOSIS — D2261 Melanocytic nevi of right upper limb, including shoulder: Secondary | ICD-10-CM | POA: Diagnosis not present

## 2023-09-02 ENCOUNTER — Ambulatory Visit: Payer: HMO | Attending: Cardiology | Admitting: Cardiology

## 2023-09-02 ENCOUNTER — Encounter: Payer: Self-pay | Admitting: Cardiology

## 2023-09-02 VITALS — BP 132/78 | HR 57 | Ht 66.0 in | Wt 213.0 lb

## 2023-09-02 DIAGNOSIS — I10 Essential (primary) hypertension: Secondary | ICD-10-CM | POA: Diagnosis not present

## 2023-09-02 DIAGNOSIS — I4729 Other ventricular tachycardia: Secondary | ICD-10-CM | POA: Diagnosis not present

## 2023-09-02 DIAGNOSIS — E782 Mixed hyperlipidemia: Secondary | ICD-10-CM | POA: Diagnosis not present

## 2023-09-02 DIAGNOSIS — I7789 Other specified disorders of arteries and arterioles: Secondary | ICD-10-CM

## 2023-09-02 NOTE — Progress Notes (Signed)
Clinical Summary Noah Cook is a 73 y.o.male seen today for follow up of the following medical problems.    1. NSVT - 13 beat run of NSVT noted during endscopy, self terminated. K was 4.4, Mg 2.2  CAD risk factors: mother with heart disease late 67s had CABG, father MI 47, HTN, HL   12/2019 echo LVEF 60-65%, grade I diastolic dysfunction. Aortic root mildly dilated 4.3 cm 01/2020 nuclear stress: apical infarct with mild ischemia. Overall low risk.      - denies any palpitations - compliant with meds - EKG today shows NSR   2. HTN - compliant with meds - home bp's 120s/70s   3. Hyperlipidemia - compliant with statin - recent labs with pcp   12/2022 TC 121 TG 85 HDL 44 LDL 66  4. CKD  - followed by nephrology   SH: coaches middle school football, coaches middle school baseball(stopped few years ago)   Past Medical History:  Diagnosis Date   CKD (chronic kidney disease)    Gout    Hypercholesteremia    Hypertension    Urinary retention    foley since sept 2014     No Known Allergies   Current Outpatient Medications  Medication Sig Dispense Refill   allopurinol (ZYLOPRIM) 300 MG tablet Take 300 mg by mouth daily.     diltiazem (CARDIZEM CD) 240 MG 24 hr capsule Take 240 mg by mouth daily.     olmesartan (BENICAR) 40 MG tablet Take 40 mg by mouth daily.     simvastatin (ZOCOR) 40 MG tablet Take 40 mg by mouth daily with breakfast.      No current facility-administered medications for this visit.     Past Surgical History:  Procedure Laterality Date   CATARACT EXTRACTION W/PHACO Left 02/04/2018   Procedure: CATARACT EXTRACTION PHACO AND INTRAOCULAR LENS PLACEMENT (IOC);  Surgeon: Noah Pierce, MD;  Location: [facility];  Service: Ophthalmology;  Laterality: Left;  CDE: 2.98   CATARACT EXTRACTION W/PHACO Right 02/18/2018   Procedure: CATARACT EXTRACTION PHACO AND INTRAOCULAR LENS PLACEMENT (IOC);  Surgeon: Noah Pierce, MD;  Location: [facility];  Service:  Ophthalmology;  Laterality: Right;  CDE: 4.70   COLONOSCOPY N/A 11/17/2019   Procedure: COLONOSCOPY;  Surgeon: Noah Bali, MD;  [facility];  Service: Endoscopy;  Laterality: N/A;  9:30   COLONOSCOPY WITH PROPOFOL N/A 03/08/2023   Procedure: COLONOSCOPY WITH PROPOFOL;  Surgeon: Noah Bal, DO;  [facility];  Service: Endoscopy;  Laterality: N/A;  915am, asa 3   GREEN LIGHT LASER TURP (TRANSURETHRAL RESECTION OF PROSTATE N/A 06/30/2013   Procedure: GREEN LIGHT LASER TURP (TRANSURETHRAL RESECTION OF PROSTATE WITH CYSTOGRAM;  Surgeon: Noah Haste, MD;  Location: [facility];  Service: Urology;  Laterality: N/A;   NO PAST SURGERIES     POLYPECTOMY  11/17/2019   Procedure: POLYPECTOMY;  Surgeon: Noah Bali, MD;  [facility];  Service: Endoscopy;;  cecum,transverse,splenic flexure   POLYPECTOMY  03/08/2023   Procedure: POLYPECTOMY INTESTINAL;  Surgeon: Noah Bal, DO;  [facility];  Service: Endoscopy;;   PROSTATE SURGERY  07/2019     No Known Allergies    Family History  Problem Relation Age of Onset   Colon cancer Maternal Grandfather      Social History Noah Cook reports that he has never smoked. He has never used smokeless tobacco. Noah Cook reports no history of alcohol use.  Physical Examination Today's Vitals   09/02/23 1425  BP: 132/78  Pulse: (!) 57  SpO2: 100%  Weight: 213 lb (96.6 kg)  Height: 5\' 6"  (1.676 m)   Body mass index is 34.38 kg/m.  Gen: resting comfortably, no acute distress HEENT: no scleral icterus, pupils equal round and reactive, no palptable cervical adenopathy,  CV: RRR, no mrg, no jvd Resp: Clear to auscultation bilaterally GI: abdomen is soft, non-tender, non-distended, normal bowel sounds, no hepatosplenomegaly MSK: extremities are warm, no edema.  Skin: warm, no rash Neuro:  no focal deficits Psych: appropriate affect   Diagnostic Studies  12/2019  echo IMPRESSIONS     1. Left ventricular ejection fraction, by estimation, is 60 to 65%. The  left ventricle has normal function. The left ventricle has no regional  wall motion abnormalities. There is mild left ventricular hypertrophy.  Left ventricular diastolic parameters  are consistent with Grade I diastolic dysfunction (impaired relaxation).   2. Right ventricular systolic function is normal. The right ventricular  size is normal. There is normal pulmonary artery systolic pressure.   3. The mitral valve is normal in structure. No evidence of mitral valve  regurgitation. No evidence of mitral stenosis.   4. The aortic valve is tricuspid. Aortic valve regurgitation is not  visualized. No aortic stenosis is present.   5. Aortic dilatation noted. There is mild dilatation of the aortic root  measuring 43 mm.    01/2020 nuclear stress There was no ST segment deviation noted during stress. This is a low risk study. Findings consistent with prior apical myocardial infarction with mild peri-infarct ischemia. The left ventricular ejection fraction is normal (55-65%).       Assessment and Plan  1. NSVT - incidentally noted during endoscopy, 13 beat run - overall benign workup for underlying heart disease with echo, low risk nuclear stress - no recent symptoms, continue current meds - EKG today shows NSR   2. HTN -at goal, continue current meds     3. Hyperlipidemia - at goal, continue current meds  4. Enlarged aortic root - noted on prior echo, will repeat        Noah Cook, M.D.

## 2023-09-02 NOTE — Patient Instructions (Signed)
Medication Instructions:   Continue all current medications.   Labwork:  none  Testing/Procedures:  Your physician has requested that you have an echocardiogram. Echocardiography is a painless test that uses sound waves to create images of your heart. It provides your doctor with information about the size and shape of your heart and how well your heart's chambers and valves are working. This procedure takes approximately one hour. There are no restrictions for this procedure. Please do NOT wear cologne, perfume, aftershave, or lotions (deodorant is allowed). Please arrive 15 minutes prior to your appointment time.  Please note: We ask at that you not bring children with you during ultrasound (echo/ vascular) testing. Due to room size and safety concerns, children are not allowed in the ultrasound rooms during exams. Our front office staff cannot provide observation of children in our lobby area while testing is being conducted. An adult accompanying a patient to their appointment will only be allowed in the ultrasound room at the discretion of the ultrasound technician under special circumstances. We apologize for any inconvenience.  Office will contact with results via phone, letter or mychart.     Follow-Up:  Your physician wants you to follow up in:  1 year.  You should receive a recall letter in the mail about 2 months prior to the time you are due.  If you don't receive this, please call our office to schedule your follow up appointment.      Any Other Special Instructions Will Be Listed Below (If Applicable).   If you need a refill on your cardiac medications before your next appointment, please call your pharmacy.

## 2023-09-16 ENCOUNTER — Ambulatory Visit: Payer: HMO | Attending: Cardiology

## 2023-09-16 DIAGNOSIS — I77810 Thoracic aortic ectasia: Secondary | ICD-10-CM

## 2023-09-16 DIAGNOSIS — I7789 Other specified disorders of arteries and arterioles: Secondary | ICD-10-CM | POA: Diagnosis not present

## 2023-09-17 LAB — ECHOCARDIOGRAM COMPLETE
AR max vel: 2.43 cm2
AV Area VTI: 2.32 cm2
AV Area mean vel: 2.49 cm2
AV Mean grad: 3 mmHg
AV Peak grad: 4.8 mmHg
Ao pk vel: 1.09 m/s
Area-P 1/2: 3.46 cm2
Calc EF: 55.9 %
MV VTI: 2.13 cm2
S' Lateral: 3.3 cm
Single Plane A2C EF: 50.5 %
Single Plane A4C EF: 59.7 %

## 2023-09-20 DIAGNOSIS — L988 Other specified disorders of the skin and subcutaneous tissue: Secondary | ICD-10-CM | POA: Diagnosis not present

## 2023-09-20 DIAGNOSIS — D485 Neoplasm of uncertain behavior of skin: Secondary | ICD-10-CM | POA: Diagnosis not present

## 2023-09-28 ENCOUNTER — Encounter: Payer: Self-pay | Admitting: *Deleted

## 2024-02-01 DIAGNOSIS — Z1331 Encounter for screening for depression: Secondary | ICD-10-CM | POA: Diagnosis not present

## 2024-02-01 DIAGNOSIS — R7309 Other abnormal glucose: Secondary | ICD-10-CM | POA: Diagnosis not present

## 2024-02-01 DIAGNOSIS — E6609 Other obesity due to excess calories: Secondary | ICD-10-CM | POA: Diagnosis not present

## 2024-02-01 DIAGNOSIS — E782 Mixed hyperlipidemia: Secondary | ICD-10-CM | POA: Diagnosis not present

## 2024-02-01 DIAGNOSIS — I129 Hypertensive chronic kidney disease with stage 1 through stage 4 chronic kidney disease, or unspecified chronic kidney disease: Secondary | ICD-10-CM | POA: Diagnosis not present

## 2024-02-01 DIAGNOSIS — Z0001 Encounter for general adult medical examination with abnormal findings: Secondary | ICD-10-CM | POA: Diagnosis not present

## 2024-02-01 DIAGNOSIS — M7021 Olecranon bursitis, right elbow: Secondary | ICD-10-CM | POA: Diagnosis not present

## 2024-02-01 DIAGNOSIS — E7849 Other hyperlipidemia: Secondary | ICD-10-CM | POA: Diagnosis not present

## 2024-02-01 DIAGNOSIS — Z6833 Body mass index (BMI) 33.0-33.9, adult: Secondary | ICD-10-CM | POA: Diagnosis not present

## 2024-02-01 DIAGNOSIS — N401 Enlarged prostate with lower urinary tract symptoms: Secondary | ICD-10-CM | POA: Diagnosis not present

## 2024-02-03 DIAGNOSIS — H35031 Hypertensive retinopathy, right eye: Secondary | ICD-10-CM | POA: Diagnosis not present

## 2024-02-03 DIAGNOSIS — H524 Presbyopia: Secondary | ICD-10-CM | POA: Diagnosis not present

## 2024-02-07 DIAGNOSIS — M109 Gout, unspecified: Secondary | ICD-10-CM | POA: Diagnosis not present

## 2024-02-07 DIAGNOSIS — N1831 Chronic kidney disease, stage 3a: Secondary | ICD-10-CM | POA: Diagnosis not present

## 2024-02-07 DIAGNOSIS — I129 Hypertensive chronic kidney disease with stage 1 through stage 4 chronic kidney disease, or unspecified chronic kidney disease: Secondary | ICD-10-CM | POA: Diagnosis not present

## 2024-02-07 NOTE — Progress Notes (Signed)
 [facility] - Clinic Note  02/09/2024   CHIEF COMPLAINT Patient presents for Retina Evaluation  HISTORY OF PRESENT ILLNESS: Noah Cook is a 73 y.o. male who presents to the clinic today for:  HPI     Retina Evaluation   In left eye.  This started 1 week ago.  Duration of 1 week.  Associated Symptoms Distortion.  Negative for Jaw Claudication.  Context:  distance vision, mid-range vision and near vision.  I, the attending physician,  performed the HPI with the patient and updated documentation appropriately.        Comments   Retina eval per Dr Nicholaus mac edema pt is reporting that vision is not as sharp and has trouble with colors pt denies any flashes or floaters       Last edited by Valdemar Rogue, MD on 02/13/2024  9:33 PM.     Pt states he sees better in OD than OS. Dr. Nicholaus did color vision testing.   Referring physician: Willma Nicholaus, OD [address] [place] [place],  [place] [address]  HISTORICAL INFORMATION:  Selected notes from the MEDICAL RECORD NUMBER Referred by Dr. Nicholaus for macular edema OS LEE:  Ocular Hx- PMH-   CURRENT MEDICATIONS: No current outpatient medications on file. (Ophthalmic Drugs)   No current facility-administered medications for this visit. (Ophthalmic Drugs)   Current Outpatient Medications (Other)  Medication Sig   allopurinol (ZYLOPRIM) 300 MG tablet Take 300 mg by mouth daily.   diltiazem (CARDIZEM CD) 240 MG 24 hr capsule Take 240 mg by mouth daily.   olmesartan (BENICAR) 40 MG tablet Take 40 mg by mouth daily.   simvastatin (ZOCOR) 40 MG tablet Take 40 mg by mouth daily with breakfast.    No current facility-administered medications for this visit. (Other)   REVIEW OF SYSTEMS:  ALLERGIES No Known Allergies PAST MEDICAL HISTORY Past Medical History:  Diagnosis Date   CKD (chronic kidney disease)    Gout    Hypercholesteremia    Hypertension    Urinary retention    foley since sept 2014   Past  Surgical History:  Procedure Laterality Date   CATARACT EXTRACTION W/PHACO Left 02/04/2018   Procedure: CATARACT EXTRACTION PHACO AND INTRAOCULAR LENS PLACEMENT (IOC);  Surgeon: Harrie Agent, MD;  Location: [facility];  Service: Ophthalmology;  Laterality: Left;  CDE: 2.98   CATARACT EXTRACTION W/PHACO Right 02/18/2018   Procedure: CATARACT EXTRACTION PHACO AND INTRAOCULAR LENS PLACEMENT (IOC);  Surgeon: Harrie Agent, MD;  Location: [facility];  Service: Ophthalmology;  Laterality: Right;  CDE: 4.70   COLONOSCOPY N/A 11/17/2019   Procedure: COLONOSCOPY;  Surgeon: Harvey Margo CROME, MD;  Location: [facility];  Service: Endoscopy;  Laterality: N/A;  9:30   COLONOSCOPY WITH PROPOFOL  N/A 03/08/2023   Procedure: COLONOSCOPY WITH PROPOFOL ;  Surgeon: Cindie Carlin POUR, DO;  Location: [facility];  Service: Endoscopy;  Laterality: N/A;  915am, asa 3   GREEN LIGHT LASER TURP (TRANSURETHRAL RESECTION OF PROSTATE N/A 06/30/2013   Procedure: GREEN LIGHT LASER TURP (TRANSURETHRAL RESECTION OF PROSTATE WITH CYSTOGRAM;  Surgeon: Donnice Gwenyth Brooks, MD;  Location: [facility];  Service: Urology;  Laterality: N/A;   NO PAST SURGERIES     POLYPECTOMY  11/17/2019   Procedure: POLYPECTOMY;  Surgeon: Harvey Margo CROME, MD;  Location: [facility];  Service: Endoscopy;;  cecum,transverse,splenic flexure   POLYPECTOMY  03/08/2023   Procedure: POLYPECTOMY INTESTINAL;  Surgeon: Cindie Carlin POUR, DO;  Location: [facility];  Service: Endoscopy;;   PROSTATE SURGERY  07/2019   FAMILY HISTORY Family History  Problem Relation Age of Onset   Colon cancer Maternal Grandfather    SOCIAL HISTORY Social History   Tobacco Use   Smoking status: Never   Smokeless tobacco: Never  Vaping Use   Vaping status: Never Used  Substance Use Topics   Alcohol use: No   Drug use: No       OPHTHALMIC EXAM:  Base Eye Exam     Visual Acuity (Snellen - Linear)       Right Left   Dist [place] 20/25 -2 20/25 -3   Dist ph [place] 20/20 20/20  -3         Tonometry (Tonopen, 9:20 AM)       Right Left   Pressure 14 16         Pupils       Dark Light Shape React APD   Right 3 2 Round Brisk None   Left 3 2 Round Brisk None         Visual Fields       Left Right    Full Full         Extraocular Movement       Right Left    Full, Ortho Full, Ortho         Neuro/Psych     Oriented x3: Yes   Mood/Affect: Normal         Dilation     Both eyes: 2.5% Phenylephrine  @ 9:20 AM           Slit Lamp and Fundus Exam     External Exam       Right Left   External Normal Normal         Slit Lamp Exam       Right Left   Lids/Lashes Dermatochalasis, mild mGD Dermatochalasis, mild MGD   Conjunctiva/Sclera White and quiet, nasal and temporal pinguecula White and quiet, nasal and temporal pinguecula   Cornea Arcus, well healed cataract wound, trace tear film debris Arcus, well healed cataract wound   Anterior Chamber Deep and clear Deep and clear   Iris Round and dilated blue Round and dilated, brown on nasal half, blue on temporal half.   Lens PC IOL in good position PC IOL in good position, trace PCO   Anterior Vitreous Vitreous syneresis Vitreous syneresis         Fundus Exam       Right Left   Disc Trace pallor, Sharp rim 1+ Pallor, sharp rim   C/D Ratio 0.4 0.4   Macula Flat, Good foveal reflex, No edema, No heme, RPE mottling Flat, blunted foveal reflex, punctact MA temp mac, No edema   Vessels Attenuated, Tortuous, mild AV crossing changes, mild copper wiring Attenuated, Tortuous, mild AV crossing changes, mild copper wiring   Periphery Attached, No heme Attached, No heme           IMAGING AND PROCEDURES  Imaging and Procedures for 02/09/2024  OCT, Retina - OU - Both Eyes       Right Eye Quality was good. Central Foveal Thickness: 290. Progression has no prior data. Findings include normal foveal contour, no IRF, no SRF, vitreomacular adhesion (Mild ERM superiorly.).   Left  Eye Central Foveal Thickness: 272. Progression has no prior data. Findings include normal foveal contour, no IRF, no SRF, vitreomacular adhesion .   Notes *Images captured and stored on drive  Diagnosis /  Impression:   No IRF/SRF OU No macular edema  Clinical management:  See below  Abbreviations: NFP - Normal foveal profile. CME - cystoid macular edema. PED - pigment epithelial detachment. IRF - intraretinal fluid. SRF - subretinal fluid. EZ - ellipsoid zone. ERM - epiretinal membrane. ORA - outer retinal atrophy. ORT - outer retinal tubulation. SRHM - subretinal hyper-reflective material. IRHM - intraretinal hyper-reflective material           ASSESSMENT/PLAN:   ICD-10-CM   1. Essential hypertension  I10     2. Hypertensive retinopathy of both eyes  H35.033 OCT, Retina - OU - Both Eyes    3. Pseudophakia of both eyes  Z96.1      **referred for macular edema OS**  - No retinal edema on exam or OCT OU  - BCVA 20/20 OU  1,2. Hypertensive retinopathy OU - discussed importance of tight BP control - monitor  - f/u PRN  3. Pseudophakia OU  - s/p CE/IOL OU (Dr. Harrie, 2019)  - IOLs in good position, doing well  - monitor    Ophthalmic Meds Ordered this visit:  No orders of the defined types were placed in this encounter.    Return if symptoms worsen or fail to improve.  There are no Patient Instructions on file for this visit.  Explained the diagnoses, plan, and follow up with the patient and they expressed understanding.  Patient expressed understanding of the importance   This document serves as a record of services personally performed by Redell JUDITHANN Hans, MD, PhD. It was created on their behalf by Almetta Pesa, an ophthalmic technician. The creation of this record is the provider's dictation and/or activities during the visit.    Electronically signed by: Almetta Pesa, OA, 02/13/24  9:34 PM of proper follow up care.   Redell JUDITHANN Hans, M.D.,  Ph.D. Diseases & Surgery of the Retina and Vitreous [facility] 02/09/2024  I have reviewed the above documentation for accuracy and completeness, and I agree with the above. Redell JUDITHANN Hans, M.D., Ph.D. 02/13/24 9:37 PM   Abbreviations: M myopia (nearsighted); A astigmatism; H hyperopia (farsighted); P presbyopia; Mrx spectacle prescription;  CTL contact lenses; OD right eye; OS left eye; OU both eyes  XT exotropia; ET esotropia; PEK punctate epithelial keratitis; PEE punctate epithelial erosions; DES dry eye syndrome; MGD meibomian gland dysfunction; ATs artificial tears; PFAT's preservative free artificial tears; NSC nuclear sclerotic cataract; PSC posterior subcapsular cataract; ERM epi-retinal membrane; PVD posterior vitreous detachment; RD retinal detachment; DM diabetes mellitus; DR diabetic retinopathy; NPDR non-proliferative diabetic retinopathy; PDR proliferative diabetic retinopathy; CSME clinically significant macular edema; DME diabetic macular edema; dbh dot blot hemorrhages; CWS cotton wool spot; POAG primary open angle glaucoma; C/D cup-to-disc ratio; HVF humphrey visual field; GVF goldmann visual field; OCT optical coherence tomography; IOP intraocular pressure; BRVO Branch retinal vein occlusion; CRVO central retinal vein occlusion; CRAO central retinal artery occlusion; BRAO branch retinal artery occlusion; RT retinal tear; SB scleral buckle; PPV pars plana vitrectomy; VH Vitreous hemorrhage; PRP panretinal laser photocoagulation; IVK intravitreal kenalog; VMT vitreomacular traction; MH Macular hole;  NVD neovascularization of the disc; NVE neovascularization elsewhere; AREDS age related eye disease study; ARMD age related macular degeneration; POAG primary open angle glaucoma; EBMD epithelial/anterior basement membrane dystrophy; ACIOL anterior chamber intraocular lens; IOL intraocular lens; PCIOL posterior chamber intraocular lens; Phaco/IOL phacoemulsification with  intraocular lens placement; PRK photorefractive keratectomy; LASIK laser assisted in situ keratomileusis; HTN hypertension; DM diabetes mellitus; COPD chronic  obstructive pulmonary disease

## 2024-02-09 ENCOUNTER — Ambulatory Visit (INDEPENDENT_AMBULATORY_CARE_PROVIDER_SITE_OTHER): Admitting: Ophthalmology

## 2024-02-09 ENCOUNTER — Encounter (INDEPENDENT_AMBULATORY_CARE_PROVIDER_SITE_OTHER): Payer: Self-pay | Admitting: Ophthalmology

## 2024-02-09 DIAGNOSIS — Z961 Presence of intraocular lens: Secondary | ICD-10-CM | POA: Diagnosis not present

## 2024-02-09 DIAGNOSIS — I10 Essential (primary) hypertension: Secondary | ICD-10-CM

## 2024-02-09 DIAGNOSIS — H35033 Hypertensive retinopathy, bilateral: Secondary | ICD-10-CM | POA: Diagnosis not present

## 2024-02-09 DIAGNOSIS — H3581 Retinal edema: Secondary | ICD-10-CM

## 2024-02-13 ENCOUNTER — Encounter (INDEPENDENT_AMBULATORY_CARE_PROVIDER_SITE_OTHER): Payer: Self-pay | Admitting: Ophthalmology

## 2024-04-08 ENCOUNTER — Observation Stay (HOSPITAL_COMMUNITY)

## 2024-04-08 ENCOUNTER — Observation Stay (HOSPITAL_COMMUNITY)
Admission: EM | Admit: 2024-04-08 | Discharge: 2024-04-08 | Disposition: A | Discharge status: Home / Self Care | Attending: Internal Medicine | Admitting: Internal Medicine

## 2024-04-08 ENCOUNTER — Observation Stay (HOSPITAL_BASED_OUTPATIENT_CLINIC_OR_DEPARTMENT_OTHER)

## 2024-04-08 ENCOUNTER — Emergency Department (HOSPITAL_COMMUNITY)

## 2024-04-08 ENCOUNTER — Other Ambulatory Visit: Payer: Self-pay

## 2024-04-08 ENCOUNTER — Encounter (HOSPITAL_COMMUNITY): Payer: Self-pay | Admitting: Emergency Medicine

## 2024-04-08 DIAGNOSIS — Z79899 Other long term (current) drug therapy: Secondary | ICD-10-CM | POA: Insufficient documentation

## 2024-04-08 DIAGNOSIS — R471 Dysarthria and anarthria: Secondary | ICD-10-CM | POA: Insufficient documentation

## 2024-04-08 DIAGNOSIS — I6389 Other cerebral infarction: Principal | ICD-10-CM | POA: Insufficient documentation

## 2024-04-08 DIAGNOSIS — R001 Bradycardia, unspecified: Secondary | ICD-10-CM | POA: Diagnosis not present

## 2024-04-08 DIAGNOSIS — I639 Cerebral infarction, unspecified: Secondary | ICD-10-CM

## 2024-04-08 DIAGNOSIS — I700 Atherosclerosis of aorta: Secondary | ICD-10-CM | POA: Insufficient documentation

## 2024-04-08 DIAGNOSIS — I672 Cerebral atherosclerosis: Secondary | ICD-10-CM | POA: Diagnosis not present

## 2024-04-08 DIAGNOSIS — R531 Weakness: Secondary | ICD-10-CM | POA: Insufficient documentation

## 2024-04-08 DIAGNOSIS — N1831 Chronic kidney disease, stage 3a: Secondary | ICD-10-CM | POA: Diagnosis not present

## 2024-04-08 DIAGNOSIS — Z23 Encounter for immunization: Secondary | ICD-10-CM | POA: Insufficient documentation

## 2024-04-08 DIAGNOSIS — E782 Mixed hyperlipidemia: Secondary | ICD-10-CM | POA: Diagnosis not present

## 2024-04-08 DIAGNOSIS — I10 Essential (primary) hypertension: Secondary | ICD-10-CM | POA: Insufficient documentation

## 2024-04-08 DIAGNOSIS — G459 Transient cerebral ischemic attack, unspecified: Principal | ICD-10-CM | POA: Diagnosis present

## 2024-04-08 DIAGNOSIS — Z7982 Long term (current) use of aspirin: Secondary | ICD-10-CM | POA: Insufficient documentation

## 2024-04-08 DIAGNOSIS — I6782 Cerebral ischemia: Secondary | ICD-10-CM | POA: Diagnosis not present

## 2024-04-08 DIAGNOSIS — Z043 Encounter for examination and observation following other accident: Secondary | ICD-10-CM | POA: Diagnosis not present

## 2024-04-08 DIAGNOSIS — R7302 Impaired glucose tolerance (oral): Secondary | ICD-10-CM | POA: Insufficient documentation

## 2024-04-08 DIAGNOSIS — I771 Stricture of artery: Secondary | ICD-10-CM | POA: Diagnosis not present

## 2024-04-08 DIAGNOSIS — W1811XA Fall from or off toilet without subsequent striking against object, initial encounter: Secondary | ICD-10-CM | POA: Diagnosis not present

## 2024-04-08 DIAGNOSIS — R4781 Slurred speech: Secondary | ICD-10-CM | POA: Diagnosis not present

## 2024-04-08 DIAGNOSIS — I129 Hypertensive chronic kidney disease with stage 1 through stage 4 chronic kidney disease, or unspecified chronic kidney disease: Secondary | ICD-10-CM | POA: Diagnosis not present

## 2024-04-08 DIAGNOSIS — R9389 Abnormal findings on diagnostic imaging of other specified body structures: Secondary | ICD-10-CM | POA: Diagnosis not present

## 2024-04-08 DIAGNOSIS — I6381 Other cerebral infarction due to occlusion or stenosis of small artery: Secondary | ICD-10-CM | POA: Diagnosis not present

## 2024-04-08 LAB — CBC
HCT: 47.5 % (ref 39.0–52.0)
Hemoglobin: 15.5 g/dL (ref 13.0–17.0)
MCH: 30 pg (ref 26.0–34.0)
MCHC: 32.6 g/dL (ref 30.0–36.0)
MCV: 92.1 fL (ref 80.0–100.0)
Platelets: 237 K/uL (ref 150–400)
RBC: 5.16 MIL/uL (ref 4.22–5.81)
RDW: 12.9 % (ref 11.5–15.5)
WBC: 6.7 K/uL (ref 4.0–10.5)
nRBC: 0 % (ref 0.0–0.2)

## 2024-04-08 LAB — HEMOGLOBIN A1C
Hgb A1c MFr Bld: 5.7 % — ABNORMAL HIGH (ref 4.8–5.6)
Mean Plasma Glucose: 116.89 mg/dL

## 2024-04-08 LAB — VITAMIN B12: Vitamin B-12: 281 pg/mL (ref 180–914)

## 2024-04-08 LAB — DIFFERENTIAL
Abs Immature Granulocytes: 0.01 K/uL (ref 0.00–0.07)
Basophils Absolute: 0 K/uL (ref 0.0–0.1)
Basophils Relative: 0 %
Eosinophils Absolute: 0.2 K/uL (ref 0.0–0.5)
Eosinophils Relative: 2 %
Immature Granulocytes: 0 %
Lymphocytes Relative: 43 %
Lymphs Abs: 2.9 K/uL (ref 0.7–4.0)
Monocytes Absolute: 0.5 K/uL (ref 0.1–1.0)
Monocytes Relative: 7 %
Neutro Abs: 3.2 K/uL (ref 1.7–7.7)
Neutrophils Relative %: 48 %

## 2024-04-08 LAB — COMPREHENSIVE METABOLIC PANEL WITH GFR
ALT: 18 U/L (ref 0–44)
AST: 22 U/L (ref 15–41)
Albumin: 4.5 g/dL (ref 3.5–5.0)
Alkaline Phosphatase: 51 U/L (ref 38–126)
Anion gap: 10 (ref 5–15)
BUN: 20 mg/dL (ref 8–23)
CO2: 24 mmol/L (ref 22–32)
Calcium: 9.3 mg/dL (ref 8.9–10.3)
Chloride: 104 mmol/L (ref 98–111)
Creatinine, Ser: 1.47 mg/dL — ABNORMAL HIGH (ref 0.61–1.24)
GFR, Estimated: 50 mL/min — ABNORMAL LOW (ref >60)
Glucose, Bld: 132 mg/dL — ABNORMAL HIGH (ref 70–99)
Potassium: 4.4 mmol/L (ref 3.5–5.1)
Sodium: 138 mmol/L (ref 135–145)
Total Bilirubin: 0.7 mg/dL (ref 0.0–1.2)
Total Protein: 7.1 g/dL (ref 6.5–8.1)

## 2024-04-08 LAB — LIPID PANEL
Cholesterol: 142 mg/dL (ref 0–200)
HDL: 49 mg/dL (ref >40)
LDL Cholesterol: 77 mg/dL (ref 0–99)
Total CHOL/HDL Ratio: 2.9 ratio
Triglycerides: 82 mg/dL (ref <150)
VLDL: 16 mg/dL (ref 0–40)

## 2024-04-08 LAB — ECHOCARDIOGRAM COMPLETE
Area-P 1/2: 2.5 cm2
Calc EF: 58.3 %
Height: 66 in
S' Lateral: 3.4 cm
Single Plane A2C EF: 59.1 %
Single Plane A4C EF: 59.8 %
Weight: 3329.6 [oz_av]

## 2024-04-08 LAB — TSH: TSH: 2.502 u[IU]/mL (ref 0.350–4.500)

## 2024-04-08 LAB — ETHANOL: Alcohol, Ethyl (B): <15 mg/dL (ref <15)

## 2024-04-08 LAB — APTT: aPTT: 27 s (ref 24–36)

## 2024-04-08 LAB — TROPONIN I (HIGH SENSITIVITY): Troponin I (High Sensitivity): 6 ng/L (ref <18)

## 2024-04-08 LAB — T4, FREE: Free T4: 0.83 ng/dL (ref 0.61–1.12)

## 2024-04-08 LAB — FOLATE: Folate: 16.3 ng/mL (ref >5.9)

## 2024-04-08 LAB — CK: Total CK: 143 U/L (ref 49–397)

## 2024-04-08 LAB — PROTIME-INR
INR: 0.9 (ref 0.8–1.2)
Prothrombin Time: 13.1 s (ref 11.4–15.2)

## 2024-04-08 MED ORDER — INFLUENZA VAC SPLIT HIGH-DOSE 0.5 ML IM SUSY
0.5000 mL | PREFILLED_SYRINGE | INTRAMUSCULAR | Status: AC
  Administered 2024-04-08: 0.5 mL via INTRAMUSCULAR
  Filled 2024-04-08: qty 0.5

## 2024-04-08 MED ORDER — LORAZEPAM 2 MG/ML IJ SOLN
1.0000 mg | Freq: Once | INTRAMUSCULAR | Status: AC
  Administered 2024-04-08: 1 mg via INTRAVENOUS
  Filled 2024-04-08: qty 1

## 2024-04-08 MED ORDER — ATORVASTATIN CALCIUM 40 MG PO TABS: 40.0000 mg | ORAL_TABLET | Freq: Every day | ORAL | 2 refills | Status: AC

## 2024-04-08 MED ORDER — ASPIRIN 81 MG PO CHEW
81.0000 mg | CHEWABLE_TABLET | Freq: Every day | ORAL | Status: DC
  Administered 2024-04-08: 81 mg via ORAL
  Filled 2024-04-08: qty 1

## 2024-04-08 MED ORDER — ASPIRIN 81 MG PO CHEW: 81.0000 mg | CHEWABLE_TABLET | Freq: Every day | ORAL | Status: AC

## 2024-04-08 MED ORDER — CLOPIDOGREL BISULFATE 75 MG PO TABS
75.0000 mg | ORAL_TABLET | Freq: Every day | ORAL | Status: DC
  Administered 2024-04-08: 75 mg via ORAL
  Filled 2024-04-08: qty 1

## 2024-04-08 MED ORDER — IOHEXOL 350 MG/ML SOLN
75.0000 mL | Freq: Once | INTRAVENOUS | Status: AC | PRN
  Administered 2024-04-08: 75 mL via INTRAVENOUS

## 2024-04-08 MED ORDER — ATORVASTATIN CALCIUM 40 MG PO TABS
40.0000 mg | ORAL_TABLET | Freq: Every day | ORAL | Status: DC
  Administered 2024-04-08: 40 mg via ORAL
  Filled 2024-04-08: qty 1

## 2024-04-08 MED ORDER — CLOPIDOGREL BISULFATE 75 MG PO TABS: 75.0000 mg | ORAL_TABLET | Freq: Every day | ORAL | 0 refills | Status: AC

## 2024-04-08 NOTE — ED Notes (Signed)
PT transported to CT at this time 

## 2024-04-08 NOTE — Progress Notes (Signed)
 Patient arrived to unit with stable vitals. Able to answer questions appropriately, pleasant demeanor. States I do feel better. Wife is at bedside. Phlebotomy in room at this time to draw labs. Urinal placed with patient for urine specimen when able.

## 2024-04-08 NOTE — Care Management Obs Status (Signed)
 MEDICARE OBSERVATION STATUS NOTIFICATION   Patient Details  Name: Noah Cook MRN: [id number] Date of Birth: [birth year]   Medicare Observation Status Notification Given:  Yes    Nena LITTIE Coffee, RN 04/08/2024, 11:00 AM

## 2024-04-08 NOTE — ED Notes (Signed)
 ED TO INPATIENT HANDOFF REPORT  ED Nurse Name and Phone #: Celina [phone]  S Name/Age/Gender Noah Cook 73 y.o. male Room/Bed: APA09/APA09  Code Status   Code Status: Prior  Home/SNF/Other Home Patient oriented to: self, place, time, and situation Is this baseline? Yes   Triage Complete: Triage complete  Chief Complaint TIA (transient ischemic attack) [G45.9]  Triage Note Pt states that he went to bed at 9pm last night and woke up around 3am and was weak all over, had trouble getting out of bed, felt like his mouth was drawn up, and had slurred speech. Pt also states he fell. Pt denies h/a or vision changes. EDP at bedside.    Allergies No Known Allergies  Level of Care/Admitting Diagnosis ED Disposition     ED Disposition  Admit   Condition  --   Comment  Hospital Area: [facility] [[id number]]  Level of Care: Telemetry [5]  Covid Evaluation: Recent COVID positive no isolation required infection day 21-90  Diagnosis: TIA (transient ischemic attack) [832385]  Admitting Physician: ADEFESO, OLADAPO [[id number]]  Attending Physician: ADEFESO, OLADAPO [[id number]]  For patients discharging to extended facilities (i.e. SNF, AL, group homes or LTAC) initiate:: Discharge to SNF/Facility Placement COVID-19 Lab Testing Protocol          B Medical/Surgery History Past Medical History:  Diagnosis Date   CKD (chronic kidney disease)    Gout    Hypercholesteremia    Hypertension    Urinary retention    foley since sept 2014   Past Surgical History:  Procedure Laterality Date   CATARACT EXTRACTION W/PHACO Left 02/04/2018   Procedure: CATARACT EXTRACTION PHACO AND INTRAOCULAR LENS PLACEMENT (IOC);  Surgeon: Harrie Agent, MD;  Location: [facility];  Service: Ophthalmology;  Laterality: Left;  CDE: 2.98   CATARACT EXTRACTION W/PHACO Right 02/18/2018   Procedure: CATARACT EXTRACTION PHACO AND INTRAOCULAR LENS PLACEMENT (IOC);  Surgeon: Harrie Agent, MD;   Location: [facility];  Service: Ophthalmology;  Laterality: Right;  CDE: 4.70   COLONOSCOPY N/A 11/17/2019   Procedure: COLONOSCOPY;  Surgeon: Harvey Margo CROME, MD;  Location: [facility];  Service: Endoscopy;  Laterality: N/A;  9:30   COLONOSCOPY WITH PROPOFOL  N/A 03/08/2023   Procedure: COLONOSCOPY WITH PROPOFOL ;  Surgeon: Cindie Carlin POUR, DO;  Location: [facility];  Service: Endoscopy;  Laterality: N/A;  915am, asa 3   GREEN LIGHT LASER TURP (TRANSURETHRAL RESECTION OF PROSTATE N/A 06/30/2013   Procedure: GREEN LIGHT LASER TURP (TRANSURETHRAL RESECTION OF PROSTATE WITH CYSTOGRAM;  Surgeon: Donnice Gwenyth Brooks, MD;  Location: [facility];  Service: Urology;  Laterality: N/A;   NO PAST SURGERIES     POLYPECTOMY  11/17/2019   Procedure: POLYPECTOMY;  Surgeon: Harvey Margo CROME, MD;  Location[facility];  Service: Endoscopy;;  cecum,transverse,splenic flexure   POLYPECTOMY  03/08/2023   Procedure: POLYPECTOMY INTESTINAL;  Surgeon: Cindie Carlin POUR, DO;  Location[facility];  Service: Endoscopy;;   PROSTATE SURGERY  07/2019     A IV Location/Drains/Wounds Patient Lines/Drains/Airways Status     Active Line/Drains/Airways     Name Placement date Placement time Site Days   Peripheral IV 04/08/24 18 G 1.16 Left Antecubital 04/08/24  0503  Antecubital  less than 1            Intake/Output Last 24 hours No intake or output data in the 24 hours ending 04/08/24 0734  Labs/Imaging Results for orders placed or performed during the hospital encounter of 04/08/24 (from the past  48 hours)  Ethanol     Status: None   Collection Time: 04/08/24  4:57 AM  Result Value Ref Range   Alcohol, Ethyl (B) <15 <15 mg/dL    Comment: (NOTE) For medical purposes only. Performed at [facility], [address], [place], [place] [address]   Protime-INR     Status: None   Collection Time: 04/08/24  4:57 AM  Result Value Ref Range   Prothrombin Time 13.1 11.4 - 15.2 seconds   INR 0.9 0.8 - 1.2     Comment: (NOTE) INR goal varies based on device and disease states. Performed at [facility], [address], [place], [place] [address]   APTT     Status: None   Collection Time: 04/08/24  4:57 AM  Result Value Ref Range   aPTT 27 24 - 36 seconds    Comment: Performed at [facility], [address], [place], [place] [address]  CBC     Status: None   Collection Time: 04/08/24  4:57 AM  Result Value Ref Range   WBC 6.7 4.0 - 10.5 K/uL   RBC 5.16 4.22 - 5.81 MIL/uL   Hemoglobin 15.5 13.0 - 17.0 g/dL   HCT 52.4 60.9 - 47.9 %   MCV 92.1 80.0 - 100.0 fL   MCH 30.0 26.0 - 34.0 pg   MCHC 32.6 30.0 - 36.0 g/dL   RDW 87.0 88.4 - 84.4 %   Platelets 237 150 - 400 K/uL   nRBC 0.0 0.0 - 0.2 %    Comment: Performed at [facility], [address] [place], [place] [address]  Differential     Status: None   Collection Time: 04/08/24  4:57 AM  Result Value Ref Range   Neutrophils Relative % 48 %   Neutro Abs 3.2 1.7 - 7.7 K/uL   Lymphocytes Relative 43 %   Lymphs Abs 2.9 0.7 - 4.0 K/uL   Monocytes Relative 7 %   Monocytes Absolute 0.5 0.1 - 1.0 K/uL   Eosinophils Relative 2 %   Eosinophils Absolute 0.2 0.0 - 0.5 K/uL   Basophils Relative 0 %   Basophils Absolute 0.0 0.0 - 0.1 K/uL   Immature Granulocytes 0 %   Abs Immature Granulocytes 0.01 0.00 - 0.07 K/uL    Comment: Performed at [facility], [address], [place], [place] [address]  Comprehensive metabolic panel     Status: Abnormal   Collection Time: 04/08/24  4:57 AM  Result Value Ref Range   Sodium 138 135 - 145 mmol/L   Potassium 4.4 3.5 - 5.1 mmol/L   Chloride 104 98 - 111 mmol/L   CO2 24 22 - 32 mmol/L   Glucose, Bld 132 (H) 70 - 99 mg/dL    Comment: Glucose reference range applies only to samples taken after fasting for at least 8 hours.   BUN 20 8 - 23 mg/dL   Creatinine, Ser 8.52 (H) 0.61 - 1.24 mg/dL   Calcium  9.3 8.9 - 10.3 mg/dL   Total Protein 7.1 6.5 - 8.1 g/dL   Albumin 4.5 3.5 - 5.0 g/dL   AST 22 15 - 41 U/L    ALT 18 0 - 44 U/L   Alkaline Phosphatase 51 38 - 126 U/L   Total Bilirubin 0.7 0.0 - 1.2 mg/dL   GFR, Estimated 50 (L) >60 mL/min    Comment: (NOTE) Calculated using the CKD-EPI Creatinine Equation (2021)    Anion gap 10 5 - 15    Comment: Performed  at [facility], [address] [place], [place] [address]  Troponin I (High Sensitivity)     Status: None   Collection Time: 04/08/24  4:57 AM  Result Value Ref Range   Troponin I (High Sensitivity) 6 <18 ng/L    Comment: (NOTE) Elevated high sensitivity troponin I (hsTnI) values and significant  changes across serial measurements may suggest ACS but many other  chronic and acute conditions are known to elevate hsTnI results.  Refer to the Links section for chest pain algorithms and additional  guidance. Performed at [facility], [address], [place], [place] [address]    DG Chest 2 View Result Date: 04/08/2024 CLINICAL DATA:  New onset neurologic symptoms, fell out of bed. EXAM: CHEST - 2 VIEW COMPARISON:  None Available. FINDINGS: Heart size and vascular pattern are normal. There is aortic tortuosity with calcification in the transverse segment. The mediastinum is otherwise unremarkable. The lungs are clear with eventration and mild elevation of the anterior right diaphragm. Thoracic cage is intact. IMPRESSION: No active cardiopulmonary disease. Electronically Signed   By: Francis Quam M.D.   On: 04/08/2024 06:11   CT ANGIO HEAD NECK W WO CM Result Date: 04/08/2024 CLINICAL DATA:  73 year old male awoke at 0300 hours with generalized weakness, slurred speech. Also states he fell. EXAM: CT ANGIOGRAPHY HEAD AND NECK WITH AND WITHOUT CONTRAST TECHNIQUE: Multidetector CT imaging of the head and neck was performed using the standard protocol during bolus administration of intravenous contrast. Multiplanar CT image reconstructions and MIPs were obtained to evaluate the vascular anatomy. Carotid stenosis measurements (when applicable)  are obtained utilizing NASCET criteria, using the distal internal carotid diameter as the denominator. RADIATION DOSE REDUCTION: This exam was performed according to the departmental dose-optimization program which includes automated exposure control, adjustment of the mA and/or kV according to patient size and/or use of iterative reconstruction technique. CONTRAST:  75mL OMNIPAQUE  IOHEXOL  350 MG/ML SOLN COMPARISON:  None Available. FINDINGS: CT HEAD Brain: Cerebral volume is within normal limits for age. No midline shift, ventriculomegaly, mass effect, evidence of mass lesion, intracranial hemorrhage or evidence of cortically based acute infarction. Small and circumscribed chronic appearing lacunar infarct right caudate (series 6, image 25). Smaller punctate hypodensity left basal ganglia lentiform (series 6, image 29). Mild for age other scattered cerebral white matter hypodensity. Calvarium and skull base: Intact. Paranasal sinuses: Visualized paranasal sinuses and mastoids are clear. Orbits: Visualized scalp soft tissues are within normal limits. CTA NECK Skeleton: Multilevel cervical spine degeneration. Mild underlying cervical scoliosis. No acute osseous abnormality identified. Upper chest: Negative; mild respiratory motion. Other neck: Nonvascular neck soft tissue spaces remarkable for asymmetric oval mixed density 16-17 mm area of the right palatine tonsil on series 8, image 91 and coronal image 97. Punctate calcification along the inferior margin or adjacent. Additional punctate bilateral postinflammatory tonsillar calcifications. Negative parapharyngeal and retropharyngeal spaces. No cervical lymphadenopathy associated. Atrophied or absent right submandibular gland. Aortic arch: Tortuous aortic arch with calcified atherosclerosis. Three vessel arch configuration. Right carotid system: Tortuous brachiocephalic artery and right CCA origin without plaque or stenosis. Patent right carotid bifurcation.  Partially retropharyngeal course of the cervical right ICA, otherwise negative. Left carotid system: Tortuosity. Negative left carotid bifurcation. No stenosis to the skull base. Vertebral arteries: Right subclavian origin calcified plaque without stenosis. Mild tortuosity, ectasia. Normal right vertebral artery origin. Right vertebral artery patent and within normal limits to the skull base. Mild proximal left subclavian plaque and tortuosity without stenosis. Normal left vertebral artery origin.  Codominant left vertebral artery patent and within normal limits to the skull base. CTA HEAD Venous dominant intracranial contrast timing. Posterior circulation: Distal vertebral arteries, vertebrobasilar junction, bilateral PICA origins, basilar artery, basilar tip, and PCA origins are patent without stenosis. Posterior communicating arteries are diminutive or absent. Mildly tortuous P1 segments. Bilateral PCA branches are within normal limits. Anterior circulation: Both ICA siphons are patent. Minimal siphon atherosclerosis. No siphon stenosis. Patent carotid termini. Normal MCA and ACA origins. Dominant left A1. Normal anterior communicating artery. Bilateral ACA branches are within normal limits. Left MCA M1 segment and bifurcation appear patent without stenosis. Right MCA M1 segment and bifurcation appear patent without stenosis. Bilateral MCA branches are within normal limits. Venous sinuses: Patent. Anatomic variants: Dominant left ACA A1. Review of the MIP images confirms the above findings IMPRESSION: 1. Negative for large vessel occlusion. Mild for age atherosclerosis in the head and neck. No arterial stenosis identified. 2. No acute intracranial abnormality by CT. But evidence chronic cerebral small vessel ischemic disease. 3. Asymmetric 17 mm area of the right palatine tonsil. But no associated cervical lymphadenopathy. This is nonspecific and might be postinflammatory but Recommend ENT follow-up, direct  visualization. 4.  Aortic Atherosclerosis (ICD10-I70.0). Electronically Signed   By: VEAR Hurst M.D.   On: 04/08/2024 06:00    Pending Labs Unresulted Labs (From admission, onward)     Start     Ordered   04/08/24 0500  Urinalysis, Routine w reflex microscopic -Urine, Clean Catch  Once,   URGENT       Question:  Specimen Source  Answer:  Urine, Clean Catch   04/08/24 0500   04/08/24 0459  Urine rapid drug screen (hosp performed)  Once,   STAT       Question:  Indication  Answer:  Altered mental status, unspecified R41.82   04/08/24 0500            Vitals/Pain Today's Vitals   04/08/24 0515 04/08/24 0530 04/08/24 0725 04/08/24 0730  BP: 128/75 124/70 130/79 139/80  Pulse: 60 (!) 58 (!) 58 69  Resp: 12 (!) 7 13 11   Temp:   98.6 F (37 C)   TempSrc:      SpO2: 98% 97% 98% 98%  Weight:      Height:      PainSc:        Isolation Precautions No active isolations  Medications Medications  iohexol  (OMNIPAQUE ) 350 MG/ML injection 75 mL (75 mLs Intravenous Contrast Given 04/08/24 0538)    Mobility walks     Focused Assessments Cardiac Assessment Handoff:  Cardiac Rhythm: Sinus bradycardia Lab Results  Component Value Date   CKTOTAL 78 04/07/2013   No results found for: DDIMER Does the Patient currently have chest pain? No   , Neuro Assessment Handoff:  Swallow screen pass? Yes  Cardiac Rhythm: Sinus bradycardia NIH Stroke Scale  Dizziness Present: No Headache Present: No Interval: Shift assessment Level of Consciousness (1a.)   : Alert, keenly responsive LOC Questions (1b. )   : Answers both questions correctly LOC Commands (1c. )   : Performs both tasks correctly Best Gaze (2. )  : Normal Visual (3. )  : No visual loss Facial Palsy (4. )    : Normal symmetrical movements Motor Arm, Left (5a. )   : No drift Motor Arm, Right (5b. ) : No drift Motor Leg, Left (6a. )  : No drift Motor Leg, Right (6b. ) : No drift Limb Ataxia (7. ): Absent Sensory (  8. )  :  Normal, no sensory loss Best Language (9. )  : No aphasia Dysarthria (10. ): Normal Extinction/Inattention (11.)   : No Abnormality Complete NIHSS TOTAL: 0     Neuro Assessment: Within Defined Limits Neuro Checks:   Shift assessment (04/08/24 0727)  Has TPA been given? No If patient is a Neuro Trauma and patient is going to OR before floor call report to 4N Charge nurse: [phone] or [phone]   R Recommendations: See Admitting Provider Note  Report given to:   Additional Notes:  Last NIH is zero.

## 2024-04-08 NOTE — Evaluation (Signed)
 Physical Therapy Evaluation Patient Details Name: Noah Cook MRN: [id number] DOB: [birth year] Today's Date: 04/08/2024  History of Present Illness  Noah Cook is a 73 y/o male with history of CKD stage III HTN and hyperlipidemia presenting with dysarthria and generalized weakness.  The patient went to bed around 9 PM which was his LKW.  He woke up around midnight to go to the bathroom without any difficulties.  He woke up again around 3 AM to go to the bathroom.  He felt weak all over.  He felt like he had some difficulty getting out of bed.  At that time, his wife felt like his speech was a bit slurred.  She felt like his face looked abnormal and may have had some droop.  She felt like his speech was slow . the patient subsequently got up from the commode and fell backwards into his bathtub.  He did not hit his head.  He did not lose consciousness.  He felt like he lost his balance.  He did have some dizziness at the time prior to the fall.  He states that he hit his buttock but denies any other head pain, neck pain, or other physical ailments at this time.  His wife helped him out of the bathtub.  He got dressed and came to the emergency department by private vehicle.  He states that he was able to get in in and out of his car without difficulty, but continued to have some generalized weakness.  Patient's wife states that his speech had returned to normal.  During this entire episode the patient denied any dysesthesias or focal extremity weakness.  He denied any headaches or visual disturbance.   Clinical Impression  Patient agreeable to PT evaluation. Patient's wife is present during session and contributes to subjective/home history taking. Patient was received sitting EOB. Independent with sit<>supine, functional transfers including toilet transfer, and ambulation without AD with no unsteadiness or LOB. Patient demonstrates good strength throughout, good ROM, good coordination, and no visual  concerns with testing. No slurring of speech during session. Patient reports this had resolved by the time he got to the hospital. Patient reports this morning's fall as his only fall. Reports he feels general weakness felt then has resolved. Patient reports feeling at his baseline, with just general arthritic pain. Educated patient on best treatment for this being general mobility, ambulation, and staying generally active. Patient does not present with urgent need for skilled physical therapy at this time. Patient discharged to care of nursing for ambulation daily as tolerated for length of stay.      If plan is discharge home, recommend the following:     Can travel by private vehicle        Equipment Recommendations None recommended by PT  Recommendations for Other Services       Functional Status Assessment Patient has had a recent decline in their functional status and demonstrates the ability to make significant improvements in function in a reasonable and predictable amount of time.     Precautions / Restrictions Precautions Precautions: None Restrictions Weight Bearing Restrictions Per Provider Order: No      Mobility  Bed Mobility Overal bed mobility: Independent     General bed mobility comments: HOB flat, no use of rails    Transfers Overall transfer level: Independent Equipment used: None     General transfer comment: STS from bedside, and toilet with no use of railings    Ambulation/Gait Ambulation/Gait assistance: Independent  Gait Distance (Feet): 250 Feet Assistive device: None Gait Pattern/deviations: WFL(Within Functional Limits), Step-through pattern Gait velocity: WNL     General Gait Details: Pt demo no unsteadiness or LOB during gait trial, good velocity, able to hold conversation while ambulating with no SOB of note  Stairs        Wheelchair Mobility     Tilt Bed    Modified Rankin (Stroke Patients Only)       Balance Overall  balance assessment: Mild deficits observed, not formally tested         Pertinent Vitals/Pain Pain Assessment Pain Assessment: No/denies pain    Home Living Family/patient expects to be discharged to:: Private residence Living Arrangements: Spouse/significant other Available Help at Discharge: Family;Available 24 hours/day Type of Home: House Home Access: Stairs to enter Entrance Stairs-Rails: None Entrance Stairs-Number of Steps: 2   Home Layout: One level Home Equipment: None      Prior Function Prior Level of Function : Independent/Modified Independent;Driving     Mobility Comments: Pt community ambulator with no AD ADLs Comments: independent     Extremity/Trunk Assessment   Upper Extremity Assessment Upper Extremity Assessment: Overall WFL for tasks assessed (Finger to nose WNL, smooth pursuits WNL with no nystagmus, shoulder flexion ROM WNL, shoulder flexion MMT 4+/5, elbow flex/ext 4+/5)    Lower Extremity Assessment Lower Extremity Assessment: Overall WFL for tasks assessed (ankle DF MMT 5/5, hip flexion 4/5, alteranting foot taps WNL, heel to shin WNL)    Cervical / Trunk Assessment Cervical / Trunk Assessment: Kyphotic  Communication   Communication Communication: No apparent difficulties    Cognition Arousal: Alert Behavior During Therapy: WFL for tasks assessed/performed   PT - Cognitive impairments: No apparent impairments       Following commands: Intact       Cueing Cueing Techniques: Verbal cues, Visual cues     General Comments      Exercises     Assessment/Plan    PT Assessment Patient does not need any further PT services  PT Problem List         PT Treatment Interventions      PT Goals (Current goals can be found in the Care Plan section)  Acute Rehab PT Goals Patient Stated Goal: Return home when medically stable PT Goal Formulation: With patient Time For Goal Achievement: 04/10/24 Potential to Achieve Goals: Good     Frequency       Co-evaluation               AM-PAC PT 6 Clicks Mobility  Outcome Measure Help needed turning from your back to your side while in a flat bed without using bedrails?: None Help needed moving from lying on your back to sitting on the side of a flat bed without using bedrails?: None Help needed moving to and from a bed to a chair (including a wheelchair)?: None Help needed standing up from a chair using your arms (e.g., wheelchair or bedside chair)?: None Help needed to walk in hospital room?: None Help needed climbing 3-5 steps with a railing? : None 6 Click Score: 24    End of Session   Activity Tolerance: Patient tolerated treatment well Patient left: in bed;with family/visitor present   PT Visit Diagnosis: Other symptoms and signs involving the nervous system (M70.101)    Time: 9143-9091 PT Time Calculation (min) (ACUTE ONLY): 12 min   Charges:   PT Evaluation $PT Eval Low Complexity: 1 Low   PT General Charges $$  ACUTE PT VISIT: 1 Visit        9:50 AM, 04/08/24 Rosaria Settler, PT, DPT [place] with [facility]

## 2024-04-08 NOTE — Progress Notes (Signed)
  Echocardiogram 2D Echocardiogram has been performed.  Noah Cook 04/08/2024, 1:54 PM

## 2024-04-08 NOTE — Discharge Summary (Addendum)
 Physician Discharge Summary   Patient: Noah Cook MRN: [id number] DOB: February 25, 1951  Admit date:     04/08/2024  Discharge date: 04/08/24  Discharge Physician: Alm Khyre Germond   PCP: Marvine Rush, MD   Recommendations at discharge:   Please follow up with primary care provider within 1-2 weeks  Please repeat BMP and CBC in one week     Hospital Course: 73 y/o male with history of CKD stage III HTN and hyperlipidemia presenting with dysarthria and generalized weakness.  The patient went to bed around 9 PM which was his LKW.  He woke up around midnight to go to the bathroom without any difficulties. He woke up again around 3 AM to go to the bathroom.  He felt weak all over.  He felt like he had some difficulty getting out of bed.  At that time, his wife felt like his speech was a bit slurred.  She felt like his face looked abnormal and may have had some droop.  She felt like his speech was slow . the patient subsequently got up from the commode and fell backwards into his bathtub.  He did not hit his head.  He did not lose consciousness.  He felt like he lost his balance.  He did have some dizziness at the time prior to the fall.  He states that he hit his buttock but denies any other head pain, neck pain, or other physical ailments at this time. His wife helped him out of the bathtub.  He got dressed and came to the emergency department by private vehicle.  He states that he was able to get in in and out of his car without difficulty, but continued to have some generalized weakness. Patient's wife states that his speech had returned to normal.  During this entire episode the patient denied any dysesthesias or focal extremity weakness.  He denied any headaches or visual disturbance.  The patient denies any new medications.  The patient had been in his usual state of health.  He stated he played 18 holes of golf on 04/07/2024 without any difficulty.  Patient denies fevers, chills, headache, chest  pain, dyspnea, nausea, vomiting, diarrhea, abdominal pain, dysuria, hematuria, hematochezia, and melena.  In the ED, the patient was afebrile hemodynamically stable with oxygen saturation 98% room air.  WBC 6.7, hemoglobin 15.5, platelets 237.  Sodium 138, potassium 4.4, bicarbonate 24, serum creatinine 1.47.  LFTs were unremarkable.  Troponin 6.  EKG showed sinus rhythm with right bundle branch block.  Chest x-ray was negative.  CTA head and neck was negative for LVO and negative for any proximal stenosis.  He was admitted for further evaluation of generalized weakness and dysarthria.  Assessment and Plan: Acute ischemic stroke -MRI brain--hyperacute/acute lacunar infarct in right basal ganglia - CTA head and neck negative for LVO, negative for proximal stenosis - Hemoglobin A1c--5.7 - Lipid panel--LDL 77 - Start aspirin  81 mg daily & plavix  75 mg daily x 21 days, then ASA 81 mg thereafter - TSH--2.502 - B12--281 - UA--not collected - 9/20 Echo--EF 55-60%, no WMA, G1DD, mild decrease RVF -9/20--case discussed with neurology, Dr. Lindsay with above antiplatelets and change to lipitor -PT eval--no follow up needed -messaged cardiology office to send patient 30 day heart monitor   Essential hypertension - Holding antihypertensive medications to allow for permissive hypertension - Hydralazine prn BP>220/120 - restart diltiazem and olmesartan after dc   Mixed hyperlipidemia - LDL 77 - d/c simvastatin - start atorvastatin   CKD stage III AA - Baseline creatinine 1.3-1.5  Impaired glucose tolerance -A1C--5.7 -continue lifestyle modification, follow up with PCP      Consultants: neurology on phone Procedures performed: none  Disposition: Home Diet recommendation:  Cardiac and Carb modified diet DISCHARGE MEDICATION: Allergies as of 04/08/2024   No Known Allergies      Medication List     STOP taking these medications    simvastatin 40 MG tablet Commonly known  as: ZOCOR       TAKE these medications    acetaminophen  500 MG tablet Commonly known as: TYLENOL  Take 1,000 mg by mouth every 6 (six) hours as needed for mild pain (pain score 1-3).   allopurinol 300 MG tablet Commonly known as: ZYLOPRIM Take 300 mg by mouth in the morning.   aspirin  81 MG chewable tablet Chew 1 tablet (81 mg total) by mouth daily. Start taking on: April 09, 2024   atorvastatin  40 MG tablet Commonly known as: LIPITOR Take 1 tablet (40 mg total) by mouth daily.   clopidogrel  75 MG tablet Commonly known as: PLAVIX  Take 1 tablet (75 mg total) by mouth daily. Start taking on: April 09, 2024   colchicine 0.6 MG tablet Take 0.6 mg by mouth 3 (three) times daily as needed (gout).   diltiazem 180 MG 24 hr tablet Commonly known as: CARDIZEM LA Take 180 mg by mouth at bedtime.   olmesartan 20 MG tablet Commonly known as: BENICAR Take 20 mg by mouth in the morning.        Discharge Exam: Filed Weights   04/08/24 0458 04/08/24 0903  Weight: 95.3 kg 94.4 kg   HEENT:  [place]/AT, No thrush, no icterus CV:  RRR, no rub, no S3, no S4 Lung:  CTA, no wheeze, no rhonchi Abd:  soft/+BS, NT Ext:  No edema, no lymphangitis, no synovitis, no rash Neuro:  CN II-XII intact, strength 4/5 in RUE, RLE, strength 4/5 LUE, LLE; sensation intact bilateral; no dysmetria; babinski equivocal   Condition at discharge: stable  The results of significant diagnostics from this hospitalization (including imaging, microbiology, ancillary and laboratory) are listed below for reference.   Imaging Studies: ECHOCARDIOGRAM COMPLETE Result Date: 04/08/2024    ECHOCARDIOGRAM REPORT   Patient Name:   Noah Cook Date of Exam: 04/08/2024 Medical Rec #:  [id number]        Height:       66.0 in Accession #:    [id number]       Weight:       208.1 lb Date of Birth:  Mar 26, 1951         BSA:          2.034 m Patient Age:    73 years         BP:           150/88 mmHg Patient Gender: M                 HR:           65 bpm. Exam Location:  [facility] Procedure: 2D Echo, Cardiac Doppler and Color Doppler (Both Spectral and Color            Flow Doppler were utilized during procedure). Indications:    Stroke  History:        Patient has prior history of Echocardiogram examinations, most                 recent 09/16/2023. TIA; Risk Factors:Hypertension and  Dyslipidemia.  Sonographer:    Ellouise Mose RDCS Referring Phys: [phone] Edward Guthmiller  Sonographer Comments: Technically difficult study due to poor echo windows. Images high and medial. IMPRESSIONS  1. Left ventricular ejection fraction, by estimation, is 55 to 60%. Left ventricular ejection fraction by 2D MOD biplane is 58.3 %. The left ventricle has normal function. The left ventricle has no regional wall motion abnormalities. Left ventricular diastolic parameters are consistent with Grade I diastolic dysfunction (impaired relaxation).  2. Right ventricular systolic function is mildly reduced. The right ventricular size is mildly enlarged. Tricuspid regurgitation signal is inadequate for assessing PA pressure.  3. The mitral valve is normal in structure. No evidence of mitral valve regurgitation. No evidence of mitral stenosis.  4. The aortic valve is tricuspid. Aortic valve regurgitation is not visualized. No aortic stenosis is present.  5. Aortic dilatation noted. There is mild dilatation of the ascending aorta, measuring 40 mm. There is mild dilatation of the aortic root, measuring 43 mm. Comparison(s): Changes from prior study are noted. 09/16/2023: LVEF 55-60%, aortic root 41 mm, ascending aorta 37 mm. FINDINGS  Left Ventricle: Left ventricular ejection fraction, by estimation, is 55 to 60%. Left ventricular ejection fraction by 2D MOD biplane is 58.3 %. The left ventricle has normal function. The left ventricle has no regional wall motion abnormalities. The left ventricular internal cavity size was normal in size. There is no left  ventricular hypertrophy. Left ventricular diastolic parameters are consistent with Grade I diastolic dysfunction (impaired relaxation). Indeterminate filling pressures. Right Ventricle: The right ventricular size is mildly enlarged. No increase in right ventricular wall thickness. Right ventricular systolic function is mildly reduced. Tricuspid regurgitation signal is inadequate for assessing PA pressure. Left Atrium: Left atrial size was normal in size. Right Atrium: Right atrial size was normal in size. Pericardium: There is no evidence of pericardial effusion. Mitral Valve: The mitral valve is normal in structure. No evidence of mitral valve regurgitation. No evidence of mitral valve stenosis. Tricuspid Valve: The tricuspid valve is normal in structure. Tricuspid valve regurgitation is not demonstrated. No evidence of tricuspid stenosis. Aortic Valve: The aortic valve is tricuspid. Aortic valve regurgitation is not visualized. No aortic stenosis is present. Pulmonic Valve: The pulmonic valve was normal in structure. Pulmonic valve regurgitation is trivial. No evidence of pulmonic stenosis. Aorta: Aortic dilatation noted. There is mild dilatation of the ascending aorta, measuring 40 mm. There is mild dilatation of the aortic root, measuring 43 mm. IAS/Shunts: The interatrial septum was not well visualized.  LEFT VENTRICLE PLAX 2D                        Biplane EF (MOD) LVIDd:         5.00 cm         LV Biplane EF:   Left LVIDs:         3.40 cm                          ventricular LV PW:         0.90 cm                          ejection LV IVS:        1.10 cm                          fraction  by LVOT diam:     2.30 cm                          2D MOD LV SV:         85                               biplane is LV SV Index:   42                               58.3 %. LVOT Area:     4.15 cm                                Diastology                                LV e' medial:    5.44 cm/s LV Volumes (MOD)                LV E/e' medial:  10.2 LV vol d, MOD    73.9 ml       LV e' lateral:   6.96 cm/s A2C:                           LV E/e' lateral: 7.9 LV vol d, MOD    73.6 ml A4C: LV vol s, MOD    30.2 ml A2C: LV vol s, MOD    29.6 ml A4C: LV SV MOD A2C:   43.7 ml LV SV MOD A4C:   73.6 ml LV SV MOD BP:    43.6 ml RIGHT VENTRICLE             IVC RV S prime:     14.40 cm/s  IVC diam: 1.80 cm TAPSE (M-mode): 2.0 cm LEFT ATRIUM             Index       RIGHT ATRIUM           Index LA diam:        3.20 cm 1.57 cm/m  RA Area:     10.60 cm LA Vol (A2C):   18.0 ml 8.85 ml/m  RA Volume:   24.20 ml  11.90 ml/m LA Vol (A4C):   12.7 ml 6.23 ml/m LA Biplane Vol: 18.3 ml 9.00 ml/m  AORTIC VALVE LVOT Vmax:   103.00 cm/s LVOT Vmean:  67.200 cm/s LVOT VTI:    0.205 m  AORTA Ao Root diam: 4.40 cm Ao Asc diam:  3.93 cm MITRAL VALVE MV Area (PHT): 2.50 cm     SHUNTS MV Decel Time: 303 msec     Systemic VTI:  0.20 m MV E velocity: 55.30 cm/s   Systemic Diam: 2.30 cm MV A velocity: 102.00 cm/s MV E/A ratio:  0.54 Vinie Maxcy MD Electronically signed by Vinie Maxcy MD Signature Date/Time: 04/08/2024/2:07:50 PM    Final    MR BRAIN WO CONTRAST Addendum Date: 04/08/2024 ADDENDUM REPORT: 04/08/2024 10:26 ADDENDUM: Dr. ALM Derk Doubek aware of findings on this exam at 1012 hours on 04/08/2024. Electronically Signed   By: VEAR Hurst M.D.   On: 04/08/2024 10:26   Result Date: 04/08/2024 CLINICAL DATA:  73 year old male 73 year old male awoke at 0300 hours with generalized weakness, slurred speech. Fell. EXAM: MRI HEAD WITHOUT CONTRAST TECHNIQUE: Multiplanar, multiecho pulse sequences of the brain and surrounding structures were obtained without intravenous contrast. COMPARISON:  CT head and CTA head and neck 0545 hours today. FINDINGS: Brain: Subtle abnormal diffusion in the right caudate nucleus and tracking into the right external capsule/lentiform on series 5 images 28-32. This is redemonstrated on coronal DWI, and does appear heterogeneously restricted  on ADC. Little to no associated T2 and FLAIR hyperintensity in the affected area. No other restricted diffusion. Chronic lacunar infarct right caudate head. Chronic lacunar infarct in the central right pons (series 17, image 50). Additional perivascular spaces in the deep gray nuclei. Mild for age scattered cerebral white matter T2 and FLAIR hyperintensity. No cortical encephalomalacia or chronic cerebral blood products identified. No midline shift, mass effect, evidence of mass lesion, ventriculomegaly, extra-axial collection or acute intracranial hemorrhage. Cervicomedullary junction and pituitary are within normal limits. Vascular: Major intracranial vascular flow voids are preserved. Skull and upper cervical spine: Negative for age visible cervical spine. Visualized bone marrow signal is within normal limits. Sinuses/Orbits: Negative, postoperative changes to the globes. Other: Mastoids are clear. Visible internal auditory structures appear normal. IMPRESSION: 1. Subtle Hyperacute/Acute Lacunar Infarct in the right basal ganglia. No associated hemorrhage or mass effect. 2. Underlying chronic lacunar infarcts in the right caudate and central right pons. Mild for age additional chronic small vessel disease. Electronically Signed: By: VEAR Hurst M.D. On: 04/08/2024 09:59   DG Chest 2 View Result Date: 04/08/2024 CLINICAL DATA:  New onset neurologic symptoms, fell out of bed. EXAM: CHEST - 2 VIEW COMPARISON:  None Available. FINDINGS: Heart size and vascular pattern are normal. There is aortic tortuosity with calcification in the transverse segment. The mediastinum is otherwise unremarkable. The lungs are clear with eventration and mild elevation of the anterior right diaphragm. Thoracic cage is intact. IMPRESSION: No active cardiopulmonary disease. Electronically Signed   By: Francis Quam M.D.   On: 04/08/2024 06:11   CT ANGIO HEAD NECK W WO CM Result Date: 04/08/2024 CLINICAL DATA:  73 year old male awoke at  0300 hours with generalized weakness, slurred speech. Also states he fell. EXAM: CT ANGIOGRAPHY HEAD AND NECK WITH AND WITHOUT CONTRAST TECHNIQUE: Multidetector CT imaging of the head and neck was performed using the standard protocol during bolus administration of intravenous contrast. Multiplanar CT image reconstructions and MIPs were obtained to evaluate the vascular anatomy. Carotid stenosis measurements (when applicable) are obtained utilizing NASCET criteria, using the distal internal carotid diameter as the denominator. RADIATION DOSE REDUCTION: This exam was performed according to the departmental dose-optimization program which includes automated exposure control, adjustment of the mA and/or kV according to patient size and/or use of iterative reconstruction technique. CONTRAST:  75mL OMNIPAQUE  IOHEXOL  350 MG/ML SOLN COMPARISON:  None Available. FINDINGS: CT HEAD Brain: Cerebral volume is within normal limits for age. No midline shift, ventriculomegaly, mass effect, evidence of mass lesion, intracranial hemorrhage or evidence of cortically based acute infarction. Small and circumscribed chronic appearing lacunar infarct right caudate (series 6, image 25). Smaller punctate hypodensity left basal ganglia lentiform (series 6, image 29). Mild for age other scattered cerebral white matter hypodensity. Calvarium and skull base: Intact. Paranasal sinuses: Visualized paranasal sinuses and mastoids are clear. Orbits: Visualized scalp soft tissues are within normal limits. CTA NECK Skeleton: Multilevel cervical spine degeneration. Mild underlying cervical scoliosis. No acute osseous abnormality identified. Upper chest: Negative; mild respiratory motion. Other  neck: Nonvascular neck soft tissue spaces remarkable for asymmetric oval mixed density 16-17 mm area of the right palatine tonsil on series 8, image 91 and coronal image 97. Punctate calcification along the inferior margin or adjacent. Additional punctate  bilateral postinflammatory tonsillar calcifications. Negative parapharyngeal and retropharyngeal spaces. No cervical lymphadenopathy associated. Atrophied or absent right submandibular gland. Aortic arch: Tortuous aortic arch with calcified atherosclerosis. Three vessel arch configuration. Right carotid system: Tortuous brachiocephalic artery and right CCA origin without plaque or stenosis. Patent right carotid bifurcation. Partially retropharyngeal course of the cervical right ICA, otherwise negative. Left carotid system: Tortuosity. Negative left carotid bifurcation. No stenosis to the skull base. Vertebral arteries: Right subclavian origin calcified plaque without stenosis. Mild tortuosity, ectasia. Normal right vertebral artery origin. Right vertebral artery patent and within normal limits to the skull base. Mild proximal left subclavian plaque and tortuosity without stenosis. Normal left vertebral artery origin. Codominant left vertebral artery patent and within normal limits to the skull base. CTA HEAD Venous dominant intracranial contrast timing. Posterior circulation: Distal vertebral arteries, vertebrobasilar junction, bilateral PICA origins, basilar artery, basilar tip, and PCA origins are patent without stenosis. Posterior communicating arteries are diminutive or absent. Mildly tortuous P1 segments. Bilateral PCA branches are within normal limits. Anterior circulation: Both ICA siphons are patent. Minimal siphon atherosclerosis. No siphon stenosis. Patent carotid termini. Normal MCA and ACA origins. Dominant left A1. Normal anterior communicating artery. Bilateral ACA branches are within normal limits. Left MCA M1 segment and bifurcation appear patent without stenosis. Right MCA M1 segment and bifurcation appear patent without stenosis. Bilateral MCA branches are within normal limits. Venous sinuses: Patent. Anatomic variants: Dominant left ACA A1. Review of the MIP images confirms the above findings  IMPRESSION: 1. Negative for large vessel occlusion. Mild for age atherosclerosis in the head and neck. No arterial stenosis identified. 2. No acute intracranial abnormality by CT. But evidence chronic cerebral small vessel ischemic disease. 3. Asymmetric 17 mm area of the right palatine tonsil. But no associated cervical lymphadenopathy. This is nonspecific and might be postinflammatory but Recommend ENT follow-up, direct visualization. 4.  Aortic Atherosclerosis (ICD10-I70.0). Electronically Signed   By: VEAR Hurst M.D.   On: 04/08/2024 06:00    Microbiology: Results for orders placed or performed during the hospital encounter of 11/14/19  SARS CORONAVIRUS 2 ([facility] 6-24 HRS) Nasopharyngeal Nasopharyngeal Swab     Status: None   Collection Time: 11/14/19  7:21 AM   Specimen: Nasopharyngeal Swab  Result Value Ref Range Status   SARS Coronavirus 2 NEGATIVE NEGATIVE Final    Comment: (NOTE) SARS-CoV-2 target nucleic acids are NOT DETECTED. The SARS-CoV-2 RNA is generally detectable in upper and lower respiratory specimens during the acute phase of infection. Negative results do not preclude SARS-CoV-2 infection, do not rule out co-infections with other pathogens, and should not be used as the sole basis for treatment or other patient management decisions. Negative results must be combined with clinical observations, patient history, and epidemiological information. The expected result is Negative. Fact Sheet for Patients: HairSlick.no Fact Sheet for Healthcare Providers: quierodirigir.com This test is not yet approved or cleared by the [place]  FDA and  has been authorized for detection and/or diagnosis of SARS-CoV-2 by FDA under an Emergency Use Authorization (EUA). This EUA will remain  in effect (meaning this test can be used) for the duration of the COVID-19 declaration under Section 56 4(b)(1) of the Act, 21 U.S.C. section  360bbb-3(b)(1), unless the authorization is terminated or revoked sooner. Performed  at [facility], [address] [place], [place] [address]     Labs: CBC: Recent Labs  Lab 04/08/24 0457  WBC 6.7  NEUTROABS 3.2  HGB 15.5  HCT 47.5  MCV 92.1  PLT 237   Basic Metabolic Panel: Recent Labs  Lab 04/08/24 0457  NA 138  K 4.4  CL 104  CO2 24  GLUCOSE 132*  BUN 20  CREATININE 1.47*  CALCIUM  9.3   Liver Function Tests: Recent Labs  Lab 04/08/24 0457  AST 22  ALT 18  ALKPHOS 51  BILITOT 0.7  PROT 7.1  ALBUMIN 4.5   CBG: No results for input(s): GLUCAP in the last 168 hours.  Discharge time spent: greater than 30 minutes.  Signed: Alm Schneider, MD [facility] 04/08/2024

## 2024-04-08 NOTE — ED Triage Notes (Signed)
 Pt states that he went to bed at 9pm last night and woke up around 3am and was weak all over, had trouble getting out of bed, felt like his mouth was drawn up, and had slurred speech. Pt also states he fell. Pt denies h/a or vision changes. EDP at bedside.

## 2024-04-08 NOTE — Hospital Course (Signed)
 73 y/o male with history of CKD stage III HTN and hyperlipidemia presenting with dysarthria and generalized weakness.  The patient went to bed around 9 PM which was his LKW.  He woke up around midnight to go to the bathroom without any difficulties. He woke up again around 3 AM to go to the bathroom.  He felt weak all over.  He felt like he had some difficulty getting out of bed.  At that time, his wife felt like his speech was a bit slurred.  She felt like his face looked abnormal and may have had some droop.  She felt like his speech was slow . the patient subsequently got up from the commode and fell backwards into his bathtub.  He did not hit his head.  He did not lose consciousness.  He felt like he lost his balance.  He did have some dizziness at the time prior to the fall.  He states that he hit his buttock but denies any other head pain, neck pain, or other physical ailments at this time. His wife helped him out of the bathtub.  He got dressed and came to the emergency department by private vehicle.  He states that he was able to get in in and out of his car without difficulty, but continued to have some generalized weakness. Patient's wife states that his speech had returned to normal.  During this entire episode the patient denied any dysesthesias or focal extremity weakness.  He denied any headaches or visual disturbance.  The patient denies any new medications.  The patient had been in his usual state of health.  He stated he played 18 holes of golf on 04/07/2024 without any difficulty.  Patient denies fevers, chills, headache, chest pain, dyspnea, nausea, vomiting, diarrhea, abdominal pain, dysuria, hematuria, hematochezia, and melena.  In the ED, the patient was afebrile hemodynamically stable with oxygen saturation 98% room air.  WBC 6.7, hemoglobin 15.5, platelets 237.  Sodium 138, potassium 4.4, bicarbonate 24, serum creatinine 1.47.  LFTs were unremarkable.  Troponin 6.  EKG showed sinus  rhythm with right bundle branch block.  Chest x-ray was negative.  CTA head and neck was negative for LVO and negative for any proximal stenosis.  He was admitted for further evaluation of generalized weakness and dysarthria.

## 2024-04-08 NOTE — H&P (Signed)
 History and Physical    Patient: Noah Cook FMW:[id number] DOB: May 26, 1951 DOA: 04/08/2024 DOS: the patient was seen and examined on 04/08/2024 PCP: Noah Rush, MD  Patient coming from: Home  Chief Complaint:  Chief Complaint  Patient presents with   Possible Stroke   HPI: Noah Cook is a 73 y/o male with history of CKD stage III HTN and hyperlipidemia presenting with dysarthria and generalized weakness.  The patient went to bed around 9 PM which was his LKW.  He woke up around midnight to go to the bathroom without any difficulties. He woke up again around 3 AM to go to the bathroom.  He felt weak all over.  He felt like he had some difficulty getting out of bed.  At that time, his wife felt like his speech was a bit slurred.  She felt like his face looked abnormal and may have had some droop.  She felt like his speech was slow . the patient subsequently got up from the commode and fell backwards into his bathtub.  He did not hit his head.  He did not lose consciousness.  He felt like he lost his balance.  He did have some dizziness at the time prior to the fall.  He states that he hit his buttock but denies any other head pain, neck pain, or other physical ailments at this time. His wife helped him out of the bathtub.  He got dressed and came to the emergency department by private vehicle.  He states that he was able to get in in and out of his car without difficulty, but continued to have some generalized weakness. Patient's wife states that his speech had returned to normal.  During this entire episode the patient denied any dysesthesias or focal extremity weakness.  He denied any headaches or visual disturbance.  The patient denies any new medications.  The patient had been in his usual state of health.  He stated he played 18 holes of golf on 04/07/2024 without any difficulty.  Patient denies fevers, chills, headache, chest pain, dyspnea, nausea, vomiting, diarrhea, abdominal  pain, dysuria, hematuria, hematochezia, and melena.  In the ED, the patient was afebrile hemodynamically stable with oxygen saturation 98% room air.  WBC 6.7, hemoglobin 15.5, platelets 237.  Sodium 138, potassium 4.4, bicarbonate 24, serum creatinine 1.47.  LFTs were unremarkable.  Troponin 6.  EKG showed sinus rhythm with right bundle branch block.  Chest x-ray was negative.  CTA head and neck was negative for LVO and negative for any proximal stenosis.  He was admitted for further evaluation of generalized weakness and dysarthria.  Review of Systems: As mentioned in the history of present illness. All other systems reviewed and are negative. Past Medical History:  Diagnosis Date   CKD (chronic kidney disease)    Gout    Hypercholesteremia    Hypertension    Urinary retention    foley since sept 2014   Past Surgical History:  Procedure Laterality Date   CATARACT EXTRACTION W/PHACO Left 02/04/2018   Procedure: CATARACT EXTRACTION PHACO AND INTRAOCULAR LENS PLACEMENT (IOC);  Surgeon: Noah Agent, MD;  Location: [facility];  Service: Ophthalmology;  Laterality: Left;  CDE: 2.98   CATARACT EXTRACTION W/PHACO Right 02/18/2018   Procedure: CATARACT EXTRACTION PHACO AND INTRAOCULAR LENS PLACEMENT (IOC);  Surgeon: Noah Agent, MD;  Location: [facility];  Service: Ophthalmology;  Laterality: Right;  CDE: 4.70   COLONOSCOPY N/A 11/17/2019   Procedure: COLONOSCOPY;  Surgeon: Noah Margo CROME,  MD;  Location: [facility];  Service: Endoscopy;  Laterality: N/A;  9:30   COLONOSCOPY WITH PROPOFOL  N/A 03/08/2023   Procedure: COLONOSCOPY WITH PROPOFOL ;  Surgeon: Noah Carlin POUR, DO;  Location: [facility];  Service: Endoscopy;  Laterality: N/A;  915am, asa 3   GREEN LIGHT LASER TURP (TRANSURETHRAL RESECTION OF PROSTATE N/A 06/30/2013   Procedure: GREEN LIGHT LASER TURP (TRANSURETHRAL RESECTION OF PROSTATE WITH CYSTOGRAM;  Surgeon: Noah Gwenyth Brooks, MD;  Location: [facility];  Service: Urology;  Laterality:  N/A;   NO PAST SURGERIES     POLYPECTOMY  11/17/2019   Procedure: POLYPECTOMY;  Surgeon: Noah Margo CROME, MD;  Location: [facility];  Service: Endoscopy;;  cecum,transverse,splenic flexure   POLYPECTOMY  03/08/2023   Procedure: POLYPECTOMY INTESTINAL;  Surgeon: Noah Carlin POUR, DO;  Location: [facility];  Service: Endoscopy;;   PROSTATE SURGERY  07/2019   Social History:  reports that he has never smoked. He has never used smokeless tobacco. He reports that he does not drink alcohol and does not use drugs.  No Known Allergies  Family History  Problem Relation Age of Onset   Colon cancer Maternal Grandfather     Prior to Admission medications   Medication Sig Start Date End Date Taking? Authorizing Provider  allopurinol (ZYLOPRIM) 300 MG tablet Take 300 mg by mouth daily.    [provider]  diltiazem (CARDIZEM CD) 240 MG 24 hr capsule Take 240 mg by mouth daily. 04/11/20   [provider]  olmesartan (BENICAR) 40 MG tablet Take 40 mg by mouth daily.    [provider]  simvastatin (ZOCOR) 40 MG tablet Take 40 mg by mouth daily with breakfast.     [provider]    Physical Exam: Vitals:   04/08/24 0515 04/08/24 0530 04/08/24 0725 04/08/24 0730  BP: 128/75 124/70 130/79 139/80  Pulse: 60 (!) 58 (!) 58 69  Resp: 12 (!) 7 13 11   Temp:   98.6 F (37 C)   TempSrc:      SpO2: 98% 97% 98% 98%  Weight:      Height:       GENERAL:  A&O x 3, NAD, well developed, cooperative, follows commands HEENT: [place]/AT, No thrush, No icterus, No oral ulcers Neck:  No neck mass, No meningismus, soft, supple CV: RRR, no S3, no S4, no rub, no JVD Lungs:  CTA, no wheeze, no rhonchi, good air movement Abd: soft/NT +BS, nondistended Ext: No edema, no lymphangitis, no cyanosis, no rashes Neuro:  CN II-XII intact, strength 4/5 in RUE, RLE, strength 4/5 LUE, LLE; sensation intact bilateral; no dysmetria; babinski equivocal  Data Reviewed: Data reviewed above in  the history Assessment and Plan: Dysarthria/generalized weakness -MRI brain - CTA head and neck negative for LVO, negative for proximal stenosis - Hemoglobin A1c - Lipid panel - Start aspirin  - Check orthostatic vital signs - TSH - B12 - UA - UDS  Essential hypertension - Holding antihypertensive medications to allow for permissive hypertension - Hydralazine prn BP>220/120  Mixed hyperlipidemia - Continue statin  CKD stage III AA - Baseline creatinine 1.3-1.5    Advance Care Planning: FULL  Consults: none  Family Communication: wife 9/20  Severity of Illness: The appropriate patient status for this patient is OBSERVATION. Observation status is judged to be reasonable and necessary in order to provide the required intensity of service to ensure the patient's safety. The patient's presenting symptoms, physical exam findings, and initial radiographic and laboratory data  in the context of their medical condition is felt to place them at decreased risk for further clinical deterioration. Furthermore, it is anticipated that the patient will be medically stable for discharge from the hospital within 2 midnights of admission.   Author: Alm Schneider, MD 04/08/2024 7:59 AM  For on call review [email or web address].

## 2024-04-10 ENCOUNTER — Telehealth: Payer: Self-pay

## 2024-04-10 DIAGNOSIS — I639 Cerebral infarction, unspecified: Secondary | ICD-10-CM

## 2024-04-10 NOTE — ED Provider Notes (Signed)
 [facility] MEDICAL SURGICAL UNIT Provider Note   CSN: [id number] Arrival date & time: 04/08/24  0443     Patient presents with: Possible Stroke   Noah Cook is a 73 y.o. male.   73 year old male that presents ER today with transient episode of generalized weakness, dizziness, dysarthria and possibly facial droop.  Patient states he was last known well around 9:00 when he went to bed.  When he woke up this morning to the bathroom he fell because he was weak all over.  Did not feel he was more weak in 1 spot of the other.  His wife stated that his speaking to her and she cannot understand what he is saying and is talking like he was intoxicated.  He did not have any focal weakness but she noted that his face did look a bit abnormal.  Of note he does appear to have a left facial droop on exam but that is baseline.  She stated the right side is what was abnormal this morning.  She became here for further evaluation.  At this time patient is ambulatory and feels like he is back to his baseline.        Prior to Admission medications   Medication Sig Start Date End Date Taking? Authorizing Provider  acetaminophen  (TYLENOL ) 500 MG tablet Take 1,000 mg by mouth every 6 (six) hours as needed for mild pain (pain score 1-3).   Yes [provider]  allopurinol (ZYLOPRIM) 300 MG tablet Take 300 mg by mouth in the morning.   Yes [provider]  colchicine 0.6 MG tablet Take 0.6 mg by mouth 3 (three) times daily as needed (gout). 02/01/24  Yes [provider]  diltiazem (CARDIZEM LA) 180 MG 24 hr tablet Take 180 mg by mouth at bedtime. 02/01/24  Yes [provider]  olmesartan (BENICAR) 20 MG tablet Take 20 mg by mouth in the morning. 02/01/24  Yes [provider]  aspirin  81 MG chewable tablet Chew 1 tablet (81 mg total) by mouth daily. 04/09/24   Evonnie Lenis, MD  atorvastatin  (LIPITOR) 40 MG tablet Take 1 tablet (40 mg total) by mouth daily. 04/08/24    Evonnie Lenis, MD  clopidogrel  (PLAVIX ) 75 MG tablet Take 1 tablet (75 mg total) by mouth daily. 04/09/24   Evonnie Lenis, MD    Allergies: Patient has no known allergies.    Review of Systems  Updated Vital Signs BP 121/63 (BP Location: Right Arm)   Pulse 67   Temp 97.8 F (36.6 C) (Oral)   Resp 18   Ht 5' 6 (1.676 m)   Wt 94.4 kg   SpO2 96%   BMI 33.59 kg/m   Physical Exam Vitals and nursing note reviewed.  Constitutional:      Appearance: He is well-developed.  HENT:     Head: Normocephalic and atraumatic.  Cardiovascular:     Rate and Rhythm: Normal rate.  Pulmonary:     Effort: Pulmonary effort is normal. No respiratory distress.  Abdominal:     General: There is no distension.  Musculoskeletal:        General: Normal range of motion.     Cervical back: Normal range of motion.  Neurological:     Mental Status: He is alert.     Comments: No altered mental status, able to give full seemingly accurate history.  Face has baseline symmetry with left-sided droop that goes away when he smiles.  This is consistent with his photo  in epic and his wife's report.  He has no right-sided droop like he had earlier. His EOM's are intact, pupils equal and reactive, vision intact, tongue and uvula midline without deviation. Upper and Lower extremity motor 5/5, intact pain perception in distal extremities, 2+ reflexes in biceps, patella and achilles tendons. Able to perform finger to nose normal with both hands. Walks without assistance or evident ataxia.       (all labs ordered are listed, but only abnormal results are displayed) Labs Reviewed  COMPREHENSIVE METABOLIC PANEL WITH GFR - Abnormal; Notable for the following components:      Result Value   Glucose, Bld 132 (*)    Creatinine, Ser 1.47 (*)    GFR, Estimated 50 (*)    All other components within normal limits  HEMOGLOBIN A1C - Abnormal; Notable for the following components:   Hgb A1c MFr Bld 5.7 (*)    All other components  within normal limits  ETHANOL  PROTIME-INR  APTT  CBC  DIFFERENTIAL  VITAMIN B12  FOLATE  T4, FREE  TSH  LIPID PANEL  CK  TROPONIN I (HIGH SENSITIVITY)    EKG: EKG Interpretation Date/Time:  Saturday April 08 2024 06:47:36 EDT Ventricular Rate:  58 PR Interval:  55 QRS Duration:  150 QT Interval:  455 QTC Calculation: 447 R Axis:   58  Text Interpretation: Sinus rhythm Short PR interval Right bundle branch block Confirmed by Haze Lonni PARAS ([id number]) on 04/09/2024 7:25:19 PM  Radiology: ECHOCARDIOGRAM COMPLETE Result Date: 04/08/2024    ECHOCARDIOGRAM REPORT   Patient Name:   Noah Cook Date of Exam: 04/08/2024 Medical Rec #:  [id number]        Height:       66.0 in Accession #:    [id number]       Weight:       208.1 lb Date of Birth:  February 05, 1951         BSA:          2.034 m Patient Age:    73 years         BP:           150/88 mmHg Patient Gender: M                HR:           65 bpm. Exam Location:  [facility] Procedure: 2D Echo, Cardiac Doppler and Color Doppler (Both Spectral and Color            Flow Doppler were utilized during procedure). Indications:    Stroke  History:        Patient has prior history of Echocardiogram examinations, most                 recent 09/16/2023. TIA; Risk Factors:Hypertension and                 Dyslipidemia.  Sonographer:    Ellouise Mose RDCS Referring Phys: [phone] DAVID TAT  Sonographer Comments: Technically difficult study due to poor echo windows. Images high and medial. IMPRESSIONS  1. Left ventricular ejection fraction, by estimation, is 55 to 60%. Left ventricular ejection fraction by 2D MOD biplane is 58.3 %. The left ventricle has normal function. The left ventricle has no regional wall motion abnormalities. Left ventricular diastolic parameters are consistent with Grade I diastolic dysfunction (impaired relaxation).  2. Right ventricular systolic function is mildly reduced. The right ventricular size is mildly enlarged. Tricuspid  regurgitation signal  is inadequate for assessing PA pressure.  3. The mitral valve is normal in structure. No evidence of mitral valve regurgitation. No evidence of mitral stenosis.  4. The aortic valve is tricuspid. Aortic valve regurgitation is not visualized. No aortic stenosis is present.  5. Aortic dilatation noted. There is mild dilatation of the ascending aorta, measuring 40 mm. There is mild dilatation of the aortic root, measuring 43 mm. Comparison(s): Changes from prior study are noted. 09/16/2023: LVEF 55-60%, aortic root 41 mm, ascending aorta 37 mm. FINDINGS  Left Ventricle: Left ventricular ejection fraction, by estimation, is 55 to 60%. Left ventricular ejection fraction by 2D MOD biplane is 58.3 %. The left ventricle has normal function. The left ventricle has no regional wall motion abnormalities. The left ventricular internal cavity size was normal in size. There is no left ventricular hypertrophy. Left ventricular diastolic parameters are consistent with Grade I diastolic dysfunction (impaired relaxation). Indeterminate filling pressures. Right Ventricle: The right ventricular size is mildly enlarged. No increase in right ventricular wall thickness. Right ventricular systolic function is mildly reduced. Tricuspid regurgitation signal is inadequate for assessing PA pressure. Left Atrium: Left atrial size was normal in size. Right Atrium: Right atrial size was normal in size. Pericardium: There is no evidence of pericardial effusion. Mitral Valve: The mitral valve is normal in structure. No evidence of mitral valve regurgitation. No evidence of mitral valve stenosis. Tricuspid Valve: The tricuspid valve is normal in structure. Tricuspid valve regurgitation is not demonstrated. No evidence of tricuspid stenosis. Aortic Valve: The aortic valve is tricuspid. Aortic valve regurgitation is not visualized. No aortic stenosis is present. Pulmonic Valve: The pulmonic valve was normal in structure. Pulmonic  valve regurgitation is trivial. No evidence of pulmonic stenosis. Aorta: Aortic dilatation noted. There is mild dilatation of the ascending aorta, measuring 40 mm. There is mild dilatation of the aortic root, measuring 43 mm. IAS/Shunts: The interatrial septum was not well visualized.  LEFT VENTRICLE PLAX 2D                        Biplane EF (MOD) LVIDd:         5.00 cm         LV Biplane EF:   Left LVIDs:         3.40 cm                          ventricular LV PW:         0.90 cm                          ejection LV IVS:        1.10 cm                          fraction by LVOT diam:     2.30 cm                          2D MOD LV SV:         85                               biplane is LV SV Index:   42  58.3 %. LVOT Area:     4.15 cm                                Diastology                                LV e' medial:    5.44 cm/s LV Volumes (MOD)               LV E/e' medial:  10.2 LV vol d, MOD    73.9 ml       LV e' lateral:   6.96 cm/s A2C:                           LV E/e' lateral: 7.9 LV vol d, MOD    73.6 ml A4C: LV vol s, MOD    30.2 ml A2C: LV vol s, MOD    29.6 ml A4C: LV SV MOD A2C:   43.7 ml LV SV MOD A4C:   73.6 ml LV SV MOD BP:    43.6 ml RIGHT VENTRICLE             IVC RV S prime:     14.40 cm/s  IVC diam: 1.80 cm TAPSE (M-mode): 2.0 cm LEFT ATRIUM             Index       RIGHT ATRIUM           Index LA diam:        3.20 cm 1.57 cm/m  RA Area:     10.60 cm LA Vol (A2C):   18.0 ml 8.85 ml/m  RA Volume:   24.20 ml  11.90 ml/m LA Vol (A4C):   12.7 ml 6.23 ml/m LA Biplane Vol: 18.3 ml 9.00 ml/m  AORTIC VALVE LVOT Vmax:   103.00 cm/s LVOT Vmean:  67.200 cm/s LVOT VTI:    0.205 m  AORTA Ao Root diam: 4.40 cm Ao Asc diam:  3.93 cm MITRAL VALVE MV Area (PHT): 2.50 cm     SHUNTS MV Decel Time: 303 msec     Systemic VTI:  0.20 m MV E velocity: 55.30 cm/s   Systemic Diam: 2.30 cm MV A velocity: 102.00 cm/s MV E/A ratio:  0.54 Vinie Maxcy MD Electronically signed by Vinie Maxcy MD Signature Date/Time: 04/08/2024/2:07:50 PM    Final    MR BRAIN WO CONTRAST Addendum Date: 04/08/2024 ADDENDUM REPORT: 04/08/2024 10:26 ADDENDUM: Dr. ALM TAT aware of findings on this exam at 1012 hours on 04/08/2024. Electronically Signed   By: VEAR Hurst M.D.   On: 04/08/2024 10:26   Result Date: 04/08/2024 CLINICAL DATA:  73 year old male 73 year old male awoke at 0300 hours with generalized weakness, slurred speech. Fell. EXAM: MRI HEAD WITHOUT CONTRAST TECHNIQUE: Multiplanar, multiecho pulse sequences of the brain and surrounding structures were obtained without intravenous contrast. COMPARISON:  CT head and CTA head and neck 0545 hours today. FINDINGS: Brain: Subtle abnormal diffusion in the right caudate nucleus and tracking into the right external capsule/lentiform on series 5 images 28-32. This is redemonstrated on coronal DWI, and does appear heterogeneously restricted on ADC. Little to no associated T2 and FLAIR hyperintensity in the affected area. No other restricted diffusion. Chronic lacunar infarct right caudate head. Chronic lacunar infarct in the central right pons (series 17, image  50). Additional perivascular spaces in the deep gray nuclei. Mild for age scattered cerebral white matter T2 and FLAIR hyperintensity. No cortical encephalomalacia or chronic cerebral blood products identified. No midline shift, mass effect, evidence of mass lesion, ventriculomegaly, extra-axial collection or acute intracranial hemorrhage. Cervicomedullary junction and pituitary are within normal limits. Vascular: Major intracranial vascular flow voids are preserved. Skull and upper cervical spine: Negative for age visible cervical spine. Visualized bone marrow signal is within normal limits. Sinuses/Orbits: Negative, postoperative changes to the globes. Other: Mastoids are clear. Visible internal auditory structures appear normal. IMPRESSION: 1. Subtle Hyperacute/Acute Lacunar Infarct in the right basal  ganglia. No associated hemorrhage or mass effect. 2. Underlying chronic lacunar infarcts in the right caudate and central right pons. Mild for age additional chronic small vessel disease. Electronically Signed: By: VEAR Hurst M.D. On: 04/08/2024 09:59   DG Chest 2 View Result Date: 04/08/2024 CLINICAL DATA:  New onset neurologic symptoms, fell out of bed. EXAM: CHEST - 2 VIEW COMPARISON:  None Available. FINDINGS: Heart size and vascular pattern are normal. There is aortic tortuosity with calcification in the transverse segment. The mediastinum is otherwise unremarkable. The lungs are clear with eventration and mild elevation of the anterior right diaphragm. Thoracic cage is intact. IMPRESSION: No active cardiopulmonary disease. Electronically Signed   By: Francis Quam M.D.   On: 04/08/2024 06:11   CT ANGIO HEAD NECK W WO CM Result Date: 04/08/2024 CLINICAL DATA:  73 year old male awoke at 0300 hours with generalized weakness, slurred speech. Also states he fell. EXAM: CT ANGIOGRAPHY HEAD AND NECK WITH AND WITHOUT CONTRAST TECHNIQUE: Multidetector CT imaging of the head and neck was performed using the standard protocol during bolus administration of intravenous contrast. Multiplanar CT image reconstructions and MIPs were obtained to evaluate the vascular anatomy. Carotid stenosis measurements (when applicable) are obtained utilizing NASCET criteria, using the distal internal carotid diameter as the denominator. RADIATION DOSE REDUCTION: This exam was performed according to the departmental dose-optimization program which includes automated exposure control, adjustment of the mA and/or kV according to patient size and/or use of iterative reconstruction technique. CONTRAST:  75mL OMNIPAQUE  IOHEXOL  350 MG/ML SOLN COMPARISON:  None Available. FINDINGS: CT HEAD Brain: Cerebral volume is within normal limits for age. No midline shift, ventriculomegaly, mass effect, evidence of mass lesion, intracranial hemorrhage  or evidence of cortically based acute infarction. Small and circumscribed chronic appearing lacunar infarct right caudate (series 6, image 25). Smaller punctate hypodensity left basal ganglia lentiform (series 6, image 29). Mild for age other scattered cerebral white matter hypodensity. Calvarium and skull base: Intact. Paranasal sinuses: Visualized paranasal sinuses and mastoids are clear. Orbits: Visualized scalp soft tissues are within normal limits. CTA NECK Skeleton: Multilevel cervical spine degeneration. Mild underlying cervical scoliosis. No acute osseous abnormality identified. Upper chest: Negative; mild respiratory motion. Other neck: Nonvascular neck soft tissue spaces remarkable for asymmetric oval mixed density 16-17 mm area of the right palatine tonsil on series 8, image 91 and coronal image 97. Punctate calcification along the inferior margin or adjacent. Additional punctate bilateral postinflammatory tonsillar calcifications. Negative parapharyngeal and retropharyngeal spaces. No cervical lymphadenopathy associated. Atrophied or absent right submandibular gland. Aortic arch: Tortuous aortic arch with calcified atherosclerosis. Three vessel arch configuration. Right carotid system: Tortuous brachiocephalic artery and right CCA origin without plaque or stenosis. Patent right carotid bifurcation. Partially retropharyngeal course of the cervical right ICA, otherwise negative. Left carotid system: Tortuosity. Negative left carotid bifurcation. No stenosis to the skull base. Vertebral arteries: Right  subclavian origin calcified plaque without stenosis. Mild tortuosity, ectasia. Normal right vertebral artery origin. Right vertebral artery patent and within normal limits to the skull base. Mild proximal left subclavian plaque and tortuosity without stenosis. Normal left vertebral artery origin. Codominant left vertebral artery patent and within normal limits to the skull base. CTA HEAD Venous dominant  intracranial contrast timing. Posterior circulation: Distal vertebral arteries, vertebrobasilar junction, bilateral PICA origins, basilar artery, basilar tip, and PCA origins are patent without stenosis. Posterior communicating arteries are diminutive or absent. Mildly tortuous P1 segments. Bilateral PCA branches are within normal limits. Anterior circulation: Both ICA siphons are patent. Minimal siphon atherosclerosis. No siphon stenosis. Patent carotid termini. Normal MCA and ACA origins. Dominant left A1. Normal anterior communicating artery. Bilateral ACA branches are within normal limits. Left MCA M1 segment and bifurcation appear patent without stenosis. Right MCA M1 segment and bifurcation appear patent without stenosis. Bilateral MCA branches are within normal limits. Venous sinuses: Patent. Anatomic variants: Dominant left ACA A1. Review of the MIP images confirms the above findings IMPRESSION: 1. Negative for large vessel occlusion. Mild for age atherosclerosis in the head and neck. No arterial stenosis identified. 2. No acute intracranial abnormality by CT. But evidence chronic cerebral small vessel ischemic disease. 3. Asymmetric 17 mm area of the right palatine tonsil. But no associated cervical lymphadenopathy. This is nonspecific and might be postinflammatory but Recommend ENT follow-up, direct visualization. 4.  Aortic Atherosclerosis (ICD10-I70.0). Electronically Signed   By: VEAR Hurst M.D.   On: 04/08/2024 06:00     Procedures   Medications Ordered in the ED  iohexol  (OMNIPAQUE ) 350 MG/ML injection 75 mL (75 mLs Intravenous Contrast Given 04/08/24 0538)  LORazepam  (ATIVAN ) injection 1 mg (1 mg Intravenous Given 04/08/24 0911)  Influenza vac split trivalent PF (FLUZONE HIGH-DOSE) injection 0.5 mL (0.5 mLs Intramuscular Given 04/08/24 1755)                                    Medical Decision Making Amount and/or Complexity of Data Reviewed Labs: ordered. Radiology: ordered. ECG/medicine  tests: ordered.  Risk Prescription drug management. Decision regarding hospitalization.   Overall his exam is at baseline this time.  His workup is reassuring however am concerned he might of a TIA event especially secondary to his age there are description of symptoms and his medical history so I did discuss with hospitalist for admission.  Family updated and agreeable     Final diagnoses:  None    ED Discharge Orders          Ordered    clopidogrel  (PLAVIX ) 75 MG tablet  Daily        04/08/24 1715    aspirin  81 MG chewable tablet  Daily        04/08/24 1715    atorvastatin  (LIPITOR) 40 MG tablet  Daily        04/08/24 1715               Lacara Dunsworth, Selinda, MD 04/10/24 [phone]

## 2024-04-10 NOTE — Telephone Encounter (Signed)
-----   Message from Alm Tat sent at 04/08/2024  5:24 PM EDT ----- Regarding: 30 day monitor Can you please send patient 30 day heart monitor for stroke? Please send results to his PCP  Thank you,  Alm

## 2024-04-10 NOTE — Telephone Encounter (Deleted)
 Can you please send patient 30 day heart monitor for stroke?  Please send results to his PCP   Thank you,   Alm

## 2024-04-10 NOTE — Telephone Encounter (Signed)
 Monitor ordered and patient enrolled in [employer]- 30 day monitor for stroke.

## 2024-04-19 DIAGNOSIS — N200 Calculus of kidney: Secondary | ICD-10-CM | POA: Diagnosis not present

## 2024-04-19 DIAGNOSIS — R3912 Poor urinary stream: Secondary | ICD-10-CM | POA: Diagnosis not present

## 2024-04-19 DIAGNOSIS — Q6211 Congenital occlusion of ureteropelvic junction: Secondary | ICD-10-CM | POA: Diagnosis not present

## 2024-04-19 DIAGNOSIS — N401 Enlarged prostate with lower urinary tract symptoms: Secondary | ICD-10-CM | POA: Diagnosis not present

## 2024-04-20 DIAGNOSIS — I498 Other specified cardiac arrhythmias: Secondary | ICD-10-CM | POA: Diagnosis not present

## 2024-04-21 ENCOUNTER — Ambulatory Visit: Attending: Cardiology

## 2024-04-21 DIAGNOSIS — I639 Cerebral infarction, unspecified: Secondary | ICD-10-CM

## 2024-05-17 DIAGNOSIS — N1832 Chronic kidney disease, stage 3b: Secondary | ICD-10-CM | POA: Diagnosis not present

## 2024-05-17 DIAGNOSIS — I4729 Other ventricular tachycardia: Secondary | ICD-10-CM | POA: Diagnosis not present

## 2024-05-17 DIAGNOSIS — E785 Hyperlipidemia, unspecified: Secondary | ICD-10-CM | POA: Diagnosis not present

## 2024-05-17 DIAGNOSIS — G459 Transient cerebral ischemic attack, unspecified: Secondary | ICD-10-CM | POA: Diagnosis not present

## 2024-05-17 DIAGNOSIS — R7309 Other abnormal glucose: Secondary | ICD-10-CM | POA: Diagnosis not present

## 2024-06-19 DIAGNOSIS — I498 Other specified cardiac arrhythmias: Secondary | ICD-10-CM

## 2024-07-07 ENCOUNTER — Ambulatory Visit: Payer: Self-pay | Admitting: Cardiology

## 2024-09-01 ENCOUNTER — Ambulatory Visit: Admitting: Nurse Practitioner
